# Patient Record
Sex: Male | Born: 1945 | Race: White | Hispanic: No | Marital: Married | Smoking: Former smoker
Health system: Southern US, Community
[De-identification: ages and names within clinical notes are randomized; demographics above are authoritative.]

## PROBLEM LIST (undated history)

## (undated) DIAGNOSIS — Z8619 Personal history of other infectious and parasitic diseases: Secondary | ICD-10-CM

## (undated) DIAGNOSIS — K4090 Unilateral inguinal hernia, without obstruction or gangrene, not specified as recurrent: Secondary | ICD-10-CM

## (undated) DIAGNOSIS — N390 Urinary tract infection, site not specified: Secondary | ICD-10-CM

## (undated) DIAGNOSIS — K5790 Diverticulosis of intestine, part unspecified, without perforation or abscess without bleeding: Secondary | ICD-10-CM

## (undated) DIAGNOSIS — K219 Gastro-esophageal reflux disease without esophagitis: Secondary | ICD-10-CM

## (undated) DIAGNOSIS — R55 Syncope and collapse: Secondary | ICD-10-CM

## (undated) HISTORY — PX: HERNIA REPAIR: SHX51

## (undated) HISTORY — DX: Personal history of other infectious and parasitic diseases: Z86.19

## (undated) HISTORY — DX: Gastro-esophageal reflux disease without esophagitis: K21.9

---

## 2002-12-28 ENCOUNTER — Encounter: Payer: Self-pay | Admitting: Emergency Medicine

## 2002-12-28 ENCOUNTER — Emergency Department (HOSPITAL_COMMUNITY): Admission: EM | Admit: 2002-12-28 | Discharge: 2002-12-28 | Payer: Self-pay | Admitting: Emergency Medicine

## 2005-03-07 ENCOUNTER — Ambulatory Visit: Payer: Self-pay | Admitting: Family Medicine

## 2005-06-24 ENCOUNTER — Ambulatory Visit: Payer: Self-pay | Admitting: Family Medicine

## 2005-07-10 ENCOUNTER — Ambulatory Visit: Payer: Self-pay | Admitting: Family Medicine

## 2006-10-13 ENCOUNTER — Ambulatory Visit: Payer: Self-pay | Admitting: Family Medicine

## 2010-03-08 ENCOUNTER — Ambulatory Visit: Payer: Self-pay | Admitting: Family Medicine

## 2010-03-08 DIAGNOSIS — R5383 Other fatigue: Secondary | ICD-10-CM

## 2010-03-08 DIAGNOSIS — R5381 Other malaise: Secondary | ICD-10-CM

## 2010-03-08 DIAGNOSIS — Z9189 Other specified personal risk factors, not elsewhere classified: Secondary | ICD-10-CM | POA: Insufficient documentation

## 2010-03-08 LAB — CONVERTED CEMR LAB
ALT: 26 units/L (ref 0–53)
AST: 23 units/L (ref 0–37)
BUN: 13 mg/dL (ref 6–23)
Basophils Relative: 0.6 % (ref 0.0–3.0)
CO2: 30 meq/L (ref 19–32)
Calcium: 9.9 mg/dL (ref 8.4–10.5)
Cholesterol: 207 mg/dL — ABNORMAL HIGH (ref 0–200)
Creatinine, Ser: 1.1 mg/dL (ref 0.4–1.5)
Eosinophils Relative: 2.3 % (ref 0.0–5.0)
Glucose, Bld: 89 mg/dL (ref 70–99)
Lymphocytes Relative: 17.6 % (ref 12.0–46.0)
Lymphs Abs: 1.3 10*3/uL (ref 0.7–4.0)
MCHC: 33.9 g/dL (ref 30.0–36.0)
Neutro Abs: 4.9 10*3/uL (ref 1.4–7.7)
Neutrophils Relative %: 66.1 % (ref 43.0–77.0)
PSA: 1.19 ng/mL (ref 0.10–4.00)
RDW: 14.1 % (ref 11.5–14.6)
Total Bilirubin: 1.4 mg/dL — ABNORMAL HIGH (ref 0.3–1.2)
Total Protein: 6.8 g/dL (ref 6.0–8.3)
Triglycerides: 212 mg/dL — ABNORMAL HIGH (ref 0.0–149.0)

## 2010-05-26 LAB — HM COLONOSCOPY: HM COLON: NORMAL

## 2010-09-25 NOTE — Assessment & Plan Note (Signed)
Summary: new patient to establish/ wants cpx/alc   Vital Signs:  Patient profile:   65 year old male Height:      74.5 inches Weight:      260 pounds BMI:     33.05 Temp:     99.3 degrees F oral Pulse rate:   80 / minute Pulse rhythm:   regular BP sitting:   148 / 80  (left arm) Cuff size:   regular  Vitals Entered By: Benny Lennert CMA Duncan Dull) (March 08, 2010 8:59 AM)  History of Present Illness: Chief complaint new patient  65 year old:  CPX  140/80  ED:  Preventive Screening-Counseling & Management  Alcohol-Tobacco     Alcohol drinks/day: 0     Smoking Status: quit     Year Quit: 1985  Caffeine-Diet-Exercise     Diet Counseling: to improve diet; diet is suboptimal     Does Patient Exercise: no     Type of exercise: occ swimming, walking      Drug Use:  no.    Allergies (verified): No Known Drug Allergies  Past History:  Past Medical History: CHICKENPOX, HX OF (ICD-V15.9)    Past Surgical History: n/c  Family History: Family History Hypertension F, d/c lymphoma, 85  Social History: Occupation: Retired, [employer] Married 2 children, 5 grandchildren Alcohol use-no Drug use-no Regular exercise-no Occupation:  employed Drug Use:  no Does Patient Exercise:  no Smoking Status:  quit  Review of Systems  General: Denies fever, chills, sweats, and anorexia. Eyes: Denies blurring. ENT: Denies earache, ear discharge, decreased hearing, nasal congestion, and sore throat. CV: Denies chest pains, dyspnea on exertion, palpitations, and syncope. Resp: Denies cough, cough with exercise, dyspnea at rest, excessive sputum, nighttime cough or wheeze, and wheezing GI: Denies nausea, vomiting, diarrhea, constipation, change in bowel habits, abdominal pain, melena, BRBPR  GU: OCC ED, WITH SOME DECREASED ERECTILE QUALITY MS: no back pain, joint pain, stiffness, and arthritis. Derm: ? PSORIASIS -- AT LEAST RED SPOTS, RECENTLY SAW DR. Yetta Barre, [place] [facility] Neuro: No  abnormal gait, frequent headaches, paresthesias, seizures, vertigo, and weakness Psych: No anxiety, behavioral problems, compulsive behavior, depression, hyperactivity, and inattentive. Endo: No polydipsia, polyphagia, polyuria, and unusual weight change Heme: No bruising or LAD Allergy: No urticaria or hayfever   Physical Exam  General:  Well-developed,well-nourished,in no acute distress; alert,appropriate and cooperative throughout examination Head:  Normocephalic and atraumatic without obvious abnormalities. No apparent alopecia or balding. Eyes:  pupils equal, pupils round, pupils reactive to light, and pupils react to accomodation.   Ears:  External ear exam shows no significant lesions or deformities.  Otoscopic examination reveals clear canals, tympanic membranes are intact bilaterally without bulging, retraction, inflammation or discharge. Hearing is grossly normal bilaterally. Nose:  External nasal examination shows no deformity or inflammation. Nasal mucosa are pink and moist without lesions or exudates. Mouth:  good dentition, no gingival abnormalities, no dental plaque, and pharynx pink and moist.   Neck:  No deformities, masses, or tenderness noted. Chest Wall:  No deformities, masses, tenderness or gynecomastia noted. Lungs:  Normal respiratory effort, chest expands symmetrically. Lungs are clear to auscultation, no crackles or wheezes. Heart:  Normal rate and regular rhythm. S1 and S2 normal without gallop, murmur, click, rub or other extra sounds. Abdomen:  Bowel sounds positive,abdomen soft and non-tender without masses, organomegaly or hernias noted. Rectal:  No external abnormalities noted. Normal sphincter tone. No rectal masses or tenderness. Genitalia:  Testes bilaterally descended without nodularity, tenderness or masses.  No scrotal masses or lesions. No penis lesions or urethral discharge. Prostate:  Prostate gland firm and smooth, no enlargement, nodularity, tenderness,  mass, asymmetry or induration. Msk:  normal ROM and no crepitation.   Extremities:  No clubbing, cyanosis, edema, or deformity noted with normal full range of motion of all joints.   Neurologic:  alert & oriented X3 and gait normal.   Skin:  multiple reddish areas on arms Cervical Nodes:  No lymphadenopathy noted Inguinal Nodes:  No significant adenopathy Psych:  Cognition and judgment appear intact. Alert and cooperative with normal attention span and concentration. No apparent delusions, illusions, hallucinations   Impression & Recommendations:  Problem # 1:  HEALTH MAINTENANCE EXAM (ICD-V70.0) The patient's preventative maintenance and recommended screening tests for an annual wellness exam were reviewed in full today. Brought up to date unless services declined.  Counselled on the importance of diet, exercise, and its role in overall health and mortality. The patient's FH and SH was reviewed, including their home life, tobacco status, and drug and alcohol status.   lose weight, increase exercise  Problem # 2:  SCREENING, COLON CANCER (ICD-V76.51) colon recall  Orders: Gastroenterology Referral (GI)  Problem # 3:  SCREENING FOR LIPOID DISORDERS (ICD-V77.91)  Orders: Venipuncture (40981) TLB-Lipid Panel (80061-LIPID)  Problem # 4:  SCREENING, DIABETES MELLITUS (ICD-V77.1)  Orders: TLB-BMP (Basic Metabolic Panel-BMET) (80048-METABOL)  Complete Medication List: 1)  Levitra 20 Mg Tabs (Vardenafil hcl) .... 1/2-1 once daily as needed  Other Orders: TLB-CBC Platelet - w/Differential (85025-CBCD) TLB-Hepatic/Liver Function Pnl (80076-HEPATIC) TLB-PSA (Prostate Specific Antigen) (84153-PSA)  Patient Instructions: 1)  Referral Appointment Information 2)  Day/Date: 3)  Time: 4)  Place/MD: 5)  Address: 6)  Phone/Fax: 7)  Patient given appointment information. Information/Orders faxed/mailed.  Prescriptions: LEVITRA 20 MG TABS (VARDENAFIL HCL) 1/2-1 once daily as needed   #10 x 11   Entered and Authorized by:   Hannah Beat MD   Signed by:   Hannah Beat MD on 03/08/2010   Method used:   Print then Give to Patient   RxID:   [id number]   Prior Medications (reviewed today): None Current Allergies (reviewed today): No known allergies    Prevention & Chronic Care Immunizations   Influenza vaccine: Not documented    Tetanus booster: Not documented   Td booster deferral: Not indicated  (03/08/2010)   Tetanus booster due: 08/26/2014    Pneumococcal vaccine: Not documented    H. zoster vaccine: Not documented  Colorectal Screening   Hemoccult: Not documented    Colonoscopy: Not documented   Colonoscopy action/deferral: GI referral  (03/08/2010)  Other Screening   PSA: Not documented   PSA ordered.   PSA action/deferral: Discussed-PSA requested  (03/08/2010)   Smoking status: quit  (03/08/2010)

## 2011-09-18 ENCOUNTER — Observation Stay (HOSPITAL_COMMUNITY)
Admission: EM | Admit: 2011-09-18 | Discharge: 2011-09-18 | Disposition: A | Discharge status: Home / Self Care | Payer: 59 | Source: Ambulatory Visit | Attending: Emergency Medicine | Admitting: Emergency Medicine

## 2011-09-18 ENCOUNTER — Encounter (HOSPITAL_COMMUNITY): Payer: Self-pay | Admitting: *Deleted

## 2011-09-18 ENCOUNTER — Observation Stay (HOSPITAL_COMMUNITY): Payer: 59

## 2011-09-18 ENCOUNTER — Other Ambulatory Visit: Payer: Self-pay

## 2011-09-18 ENCOUNTER — Emergency Department (HOSPITAL_COMMUNITY): Payer: 59

## 2011-09-18 DIAGNOSIS — R0602 Shortness of breath: Secondary | ICD-10-CM | POA: Insufficient documentation

## 2011-09-18 DIAGNOSIS — R079 Chest pain, unspecified: Principal | ICD-10-CM | POA: Insufficient documentation

## 2011-09-18 LAB — CBC
HCT: 48.5 % (ref 39.0–52.0)
Hemoglobin: 16.7 g/dL (ref 13.0–17.0)
MCH: 33.1 pg (ref 26.0–34.0)
MCHC: 34.4 g/dL (ref 30.0–36.0)
MCV: 96 fL (ref 78.0–100.0)
Platelets: 235 10*3/uL (ref 150–400)
RBC: 5.05 MIL/uL (ref 4.22–5.81)
RDW: 13.4 % (ref 11.5–15.5)
WBC: 5.8 10*3/uL (ref 4.0–10.5)

## 2011-09-18 LAB — BASIC METABOLIC PANEL
BUN: 13 mg/dL (ref 6–23)
CO2: 25 mEq/L (ref 19–32)
Calcium: 9.6 mg/dL (ref 8.4–10.5)
Chloride: 103 mEq/L (ref 96–112)
Creatinine, Ser: 1.05 mg/dL (ref 0.50–1.35)
GFR calc Af Amer: 85 mL/min — ABNORMAL LOW (ref >90)
GFR calc non Af Amer: 73 mL/min — ABNORMAL LOW (ref >90)
Glucose, Bld: 112 mg/dL — ABNORMAL HIGH (ref 70–99)
Potassium: 3.9 mEq/L (ref 3.5–5.1)
Sodium: 139 mEq/L (ref 135–145)

## 2011-09-18 LAB — CARDIAC PANEL(CRET KIN+CKTOT+MB+TROPI)
CK, MB: 2.5 ng/mL (ref 0.3–4.0)
Relative Index: 2.4 (ref 0.0–2.5)
Total CK: 104 U/L (ref 7–232)
Troponin I: <0.3 ng/mL (ref <0.30)

## 2011-09-18 LAB — POCT I-STAT TROPONIN I
Troponin i, poc: 0 ng/mL (ref 0.00–0.08)
Troponin i, poc: 0 ng/mL (ref 0.00–0.08)

## 2011-09-18 MED ORDER — METOPROLOL TARTRATE 25 MG PO TABS
100.0000 mg | ORAL_TABLET | Freq: Once | ORAL | Status: AC
  Administered 2011-09-18: 100 mg via ORAL
  Filled 2011-09-18: qty 4

## 2011-09-18 MED ORDER — METOPROLOL TARTRATE 1 MG/ML IV SOLN
INTRAVENOUS | Status: AC
  Administered 2011-09-18: 5 mg via INTRAVENOUS
  Filled 2011-09-18: qty 15

## 2011-09-18 MED ORDER — NITROGLYCERIN 0.4 MG SL SUBL
SUBLINGUAL_TABLET | SUBLINGUAL | Status: AC
  Administered 2011-09-18: 0.4 mg via SUBLINGUAL
  Filled 2011-09-18: qty 25

## 2011-09-18 MED ORDER — OMEPRAZOLE 20 MG PO CPDR: 20.0000 mg | DELAYED_RELEASE_CAPSULE | Freq: Every day | ORAL | Status: DC

## 2011-09-18 MED ORDER — METOPROLOL TARTRATE 1 MG/ML IV SOLN
5.0000 mg | Freq: Once | INTRAVENOUS | Status: AC
  Administered 2011-09-18: 5 mg via INTRAVENOUS

## 2011-09-18 MED ORDER — ASPIRIN EC 325 MG PO TBEC: 325.0000 mg | DELAYED_RELEASE_TABLET | Freq: Every day | ORAL | Status: DC

## 2011-09-18 MED ORDER — MAGNESIUM HYDROXIDE 400 MG/5ML PO SUSP: 30.0000 mL | Freq: Two times a day (BID) | ORAL | Status: DC | PRN

## 2011-09-18 MED ORDER — IOHEXOL 350 MG/ML SOLN
80.0000 mL | Freq: Once | INTRAVENOUS | Status: AC | PRN
  Administered 2011-09-18: 80 mL via INTRAVENOUS

## 2011-09-18 MED ORDER — ONDANSETRON HCL 4 MG/2ML IJ SOLN: 4.0000 mg | Freq: Four times a day (QID) | INTRAMUSCULAR | Status: DC | PRN

## 2011-09-18 MED ORDER — ACETAMINOPHEN 325 MG PO TABS: 650.0000 mg | ORAL_TABLET | ORAL | Status: DC | PRN

## 2011-09-18 MED ORDER — FAMOTIDINE 20 MG PO TABS: 20.0000 mg | ORAL_TABLET | Freq: Two times a day (BID) | ORAL | Status: DC

## 2011-09-18 MED ORDER — MORPHINE SULFATE 4 MG/ML IJ SOLN: 4.0000 mg | INTRAMUSCULAR | Status: DC | PRN

## 2011-09-18 MED ORDER — ASPIRIN 81 MG PO CHEW
324.0000 mg | CHEWABLE_TABLET | Freq: Once | ORAL | Status: AC
  Administered 2011-09-18: 324 mg via ORAL
  Filled 2011-09-18: qty 4

## 2011-09-18 MED ORDER — NITROGLYCERIN 0.4 MG SL SUBL
0.4000 mg | SUBLINGUAL_TABLET | Freq: Once | SUBLINGUAL | Status: AC
  Administered 2011-09-18: 0.4 mg via SUBLINGUAL

## 2011-09-18 NOTE — ED Notes (Signed)
Pt state that he has had chest pain and shortness of breath that started 3 days ago. Pt state that he has tingling in both hands as well. Pt states that he took Tums, with little relief because he thought it was indigestion. Pt states that pain is worse when he is walking.

## 2011-09-18 NOTE — ED Notes (Signed)
Pt has returned from ct. Tolerated testing well. Denies chest pain or sob.

## 2011-09-18 NOTE — ED Notes (Signed)
BMI IS 31.7

## 2011-09-18 NOTE — Progress Notes (Signed)
Observation review is complete. 

## 2011-09-18 NOTE — ED Provider Notes (Signed)
Pt in CDU on CP protocol.  Has already returned from coronary CT and results pending.  He is currently asymptomatic.  VSS, heart w/ RRR and lungs CTA.  1:03 PM   Discussed CT results w/ Dr. Carlota Raspberry. He reports 40% calcium score, mild step off in proximal RCA and a thickened distal esophagus, more consistent w/ reflux esophagitis than neoplasm, as well as pulmonary nodules. Pt has been made aware of results. Prescribed prilosec and pepcid and referred back to PCP as well as to cardiology.  He is aware of radiologist recommendation for f/u imaging of lungs.  Return precautions discussed.  1:31 PM   Erik Edwards, [place] 09/18/11 2012

## 2011-09-18 NOTE — ED Notes (Signed)
C/o chest pain onset yest describes as tightness, states actually tightness was worse yest. States he slept very good last pm , got up this am was drinking his coffee and the pain returned.

## 2011-09-18 NOTE — ED Provider Notes (Signed)
History    66 year old male with chest pain. Symptom onset was yesterday afternoon. Patient describes a sensation of tightness in the center of chest. No radiation. Episodes last minutes to hours. Initially thought pain was indigestion. Pain free when went to bed last night and when he woke up this morning. Began having the pain again this am shortly after drinking coffee. No shortness of breath. No fever or chills.No leg Pain or swelling. Denies any significant past medical history.  CSN: [id number]  Arrival date & time 09/18/11  4098   First MD Initiated Contact with Patient 09/18/11 [phone]      Chief Complaint  Patient presents with  . Chest Pain    (Consider location/radiation/quality/duration/timing/severity/associated sxs/prior treatment) HPI  History reviewed. No pertinent past medical history.  History reviewed. No pertinent past surgical history.  History reviewed. No pertinent family history.  History  Substance Use Topics  . Smoking status: Former Games developer  . Smokeless tobacco: Not on file  . Alcohol Use: No      Review of Systems   Review of symptoms negative unless otherwise noted in HPI.   Allergies  Review of patient's allergies indicates no known allergies.  Home Medications   Current Outpatient Rx  Name Route Sig Dispense Refill  . PSEUDOEPHEDRINE-ACETAMINOPHEN 30-500 MG PO TABS Oral Take 1 tablet by mouth every 4 (four) hours as needed.      BP 129/79  Pulse 79  Temp(Src) 98.2 F (36.8 C) (Oral)  Resp 18  SpO2 98%  Physical Exam  Nursing note and vitals reviewed. Constitutional: He appears well-developed and well-nourished. No distress.       Sitting up in bed. No acute distress.  HENT:  Head: Normocephalic and atraumatic.  Eyes: Conjunctivae are normal. Right eye exhibits no discharge. Left eye exhibits no discharge.  Neck: Neck supple.  Cardiovascular: Normal rate, regular rhythm and normal heart sounds.  Exam reveals no gallop and no  friction rub.   No murmur heard. Pulmonary/Chest: Effort normal and breath sounds normal. No respiratory distress. He exhibits no tenderness.       Chest pain is nonreproducible on exam. Chest wall is grossly normal to inspection.  Abdominal: Soft. He exhibits no distension. There is no tenderness.  Musculoskeletal: He exhibits no edema and no tenderness.       No lower extremity edema. No calf tenderness.  Neurological: He is alert.  Skin: Skin is warm and dry. He is not diaphoretic.  Psychiatric: He has a normal mood and affect. His behavior is normal. Thought content normal.    ED Course  Procedures (including critical care time)  Labs Reviewed  BASIC METABOLIC PANEL - Abnormal; Notable for the following:    Glucose, Bld 112 (*)    GFR calc non Af Amer 73 (*)    GFR calc Af Amer 85 (*)    All other components within normal limits  CBC  POCT I-STAT TROPONIN I  I-STAT TROPONIN I   Dg Chest 2 View  09/18/2011  *RADIOLOGY REPORT*  Clinical Data: Chest pain, shortness of breath.  CHEST - 2 VIEW  Comparison: None  Findings: Heart and mediastinal contours are within normal limits. No focal opacities or effusions.  No acute bony abnormality.  IMPRESSION: No active cardiopulmonary disease.  Original Report Authenticated By: Cyndie Chime, M.D.   EKG: Rhythm: Normal sinus. Rate: 93. Axis: Normal. Intervals/conduction: Q waves anteriorly. ST segments: Nonspecific ST changes. Some T-wave flattening in lead 3 and in V3.  9:05  AM. Micah Flesher to assess patient but not in room. Back in x-ray. Briefly spoke with wife. Will assess on return to room.  1. Chest pain       MDM  66 year old male with chest pain. Consider ACS, infectious, pulmonary embolism, gastroesophageal reflux, musculoskeletal, anxiety. Low clinical suspicion for PE. Doubt musculoskeletal without history of trauma no reproducible with palpation. Doubt infectious. Patient is afebrile and has no respiratory complaints. Chest x-ray  has no focal abnormalities. Possibly GERD but has no prior history. Doubt anxiety. Patient has a TIMI 1 with aspirin use in the past 7 days. EKG has no ischemic changes. Troponin is within normal limits. No diagnosed hypertension, diabetes, CAD, and is a nonsmoker. The pain is somewhat atypical for ACS in nature and resolved on presentation. Will transfer the patient to the CDU under the chest pain protocol for enzyme r/o and with the intention of obtaining a CT scan of his coronary arteries in the morning.       Raeford Razor, MD 09/18/11 1104

## 2011-09-19 ENCOUNTER — Ambulatory Visit (INDEPENDENT_AMBULATORY_CARE_PROVIDER_SITE_OTHER): Payer: 59 | Admitting: Family Medicine

## 2011-09-19 ENCOUNTER — Encounter: Payer: Self-pay | Admitting: Family Medicine

## 2011-09-19 DIAGNOSIS — R079 Chest pain, unspecified: Secondary | ICD-10-CM

## 2011-09-19 NOTE — ED Provider Notes (Signed)
Medical screening examination/treatment/procedure(s) were performed by non-physician practitioner and as supervising physician I was immediately available for consultation/collaboration.  Ashyr Hedgepath, MD 09/19/11 0749 

## 2011-09-19 NOTE — Progress Notes (Signed)
Patient Name: Erik Edwards Date of Birth: 08-Jul-1946 Age: 66 y.o. Medical Record Number: [id number] Gender: male Date of Encounter: 09/19/2011  History of Present Illness:  Erik Edwards is a 66 y.o. very pleasant male patient who presents with the following:  Feeling like some chest pain in her chest - also felt short of breath.  Admitted to chest pain unit - ruled out for MI, and had a CT angiogram of heard and coronary calcium score done. Mild plaque, with some of the vascularization poorly visualized.  Now CP resolved.  On Pepcid bid and omeprazole in AM  The week before had been having a lot of significant reflux and heartburn symptoms   CT ANGIOGRAPHY OF THE HEART, CORONARY ARTERY, STRUCTURE, AND MORPHOLOGY   CONTRAST: 80mL OMNIPAQUE IOHEXOL 350 MG/ML IV SOLN   COMPARISON:  Chest radiograph 09/18/2011.   TECHNIQUE:  CT angiography of the coronary vessels was performed on a 256 channel system using prospective ECG gating.  A scout and noncontrast exam (for calcium scoring) were performed.  Circulation time was measured using a test bolus.  Coronary CTA was performed with sub mm slice collimation during portions of the cardiac cycle after prior injection of iodinated contrast.  Imaging post processing was performed on an independent workstation creating multiplanar and 3-D images, and quantitative analysis of the heart and coronary arteries.  Note that this exam targets the heart and the chest was not imaged in its entirety.   PREMEDICATION: Lopressor 100 mg, P.O. Lopressor 10 mg, IV Nitroglycerin 0.4 mcg, sublingual.   FINDINGS: Technical quality:  Good   Heart rate:  60   CORONARY ARTERIES: Left main coronary artery:  Widely patent Left anterior descending:  Widely patent.  Large first and second diagonals are present. Left circumflex:  Tiny left circumflex is present which is poorly visualized.  Distally this can be seen opacified. Right coronary artery:   The origin is widely patent.  There is poor visualization of the distal proximal right coronary artery prior to the origin of the first acute marginal. Obvious step-off deformity is present on reconstructions and there is no stenosis.  Small amount of calcified plaque is present just proximal to the origin of the first acute marginal.  At the area of step-off deformity due to motion artifact, there is no calcification present.  Small amount of calcified plaque is present along the distal right coronary artery prior to the origin of the posterior descending. No resulting stenosis. Posterior descending artery:  Widely patent. Dominance:  Right.  Notably, there is continuation of the distal right coronary artery beyond the posterior descending coronary artery, feeding the posterior and inferior left ventricular wall.   CORONARY CALCIUM: Total Agatston Score:  23.05   MESA database percentile:  40   CARDIAC MEASUREMENTS: Interventricular septum (6 - 12 mm):  11 mm LV posterior wall (6 - 12 mm):  10 mm LV diameter in diastole (35 - 52 mm):  47 mm   AORTA AND PULMONARY MEASUREMENTS: Aortic root (21 - 40 mm):             29 mm  at the annulus             40 mm  at the sinuses of Valsalva             31 mm  at the sinotubular junction Ascending aorta ( <  40 mm):  37 mm Descending aorta ( <  40 mm):  29 mm Main pulmonary  artery:  ( <  30 mm):  29 mm   EXTRACARDIAC FINDINGS: Dependent atelectasis.  No pericardial effusion.  There is a small hiatal hernia.  There is a distal esophageal thickening, probably representing esophagitis which could be a contributor to the chest pain.   Subpleural pulmonary nodule measuring 5 mm is present in the right middle lobe.  Followup recommendations below.  Thoracic spondylosis incidentally noted.   IMPRESSION:   1.  Mild coronary artery disease.  The patient's total coronary artery calcium score is 23, which is 40 percentile for  patient's matched age and gender. 2.  No hemodynamically significant stenosis is identified.  There is some step-off deformity in the distal proximal right coronary artery.  There is no calcified plaque in this region.  Diminutive circumflex with circumflex territory fed by a diagonal branches and long distal right coronary artery branch. 3.  Right coronary artery dominance. 4.  Small hiatal hernia and distal esophageal thickening. Statistically this likely relates to gastroesophageal reflux but esophageal thickening is nonspecific on CT. 5.  Right middle lobe 5 mm pulmonary nodules.  Follow-up noncontrast chest CT recommended. If the patient is at high risk for bronchogenic carcinoma, follow-up chest CT at 6-12 months is recommended.  If the patient is at low risk for bronchogenic carcinoma, follow-up chest CT at 12 months is recommended.  This recommendation follows the consensus statement: Guidelines for Management of Small Pulmonary Nodules Detected on CT Scans: A Statement from the Fleischner Society as published in Radiology 2005; 237:395-400. Online at: DietDisorder.cz.   Report was called to Otilio Miu, PA at 1315 hours on 09/18/2011.   Original Report Authenticated By: Andreas Newport, M.D.   Past Medical History, Surgical History, Social History, Family History, Problem List, Medications, and Allergies have been reviewed and updated if relevant.  Review of Systems: ROS: GEN: Acute illness details above GI: Tolerating PO intake GU: maintaining adequate hydration and urination Pulm: No SOB Interactive and getting along well at home.  Otherwise, ROS is as per the HPI.   Physical Examination: Filed Vitals:   09/19/11 0832  BP: 130/78  Pulse: 75  Temp: 98.8 F (37.1 C)  TempSrc: Oral  Height: 6\' 3"  (1.905 m)  Weight: 258 lb 12.8 oz (117.391 kg)  SpO2: 98%    Body mass index is 32.35 kg/(m^2).   GEN: WDWN,  NAD, Non-toxic, A & O x 3 HEENT: Atraumatic, Normocephalic. Neck supple. No masses, No LAD. Ears and Nose: No external deformity. CV: RRR, No M/G/R. No JVD. No thrill. No extra heart sounds. Chest wall nontender PULM: CTA B, no wheezes, crackles, rhonchi. No retractions. No resp. distress. No accessory muscle use. EXTR: No c/c/e NEURO Normal gait.  PSYCH: Normally interactive. Conversant. Not depressed or anxious appearing.  Calm demeanor.    Assessment and Plan: 1. Chest pain  Ambulatory referral to Cardiology    D/w scan with Dr. Antoine Poche Given chest pain, age, some lipids - would still need to have a stress test like an ETT or other to rule out inducible ischemia  Most likely GI pathology, cont with PPI and H2 blockade Some sensation of difficulty and irritation with swallowing - d/w he and his wife, for now, cont meds, but if not improved in a few weeks will likely need endoscopic eval

## 2011-09-20 ENCOUNTER — Ambulatory Visit: Payer: 59 | Admitting: Cardiovascular Disease

## 2011-09-20 ENCOUNTER — Ambulatory Visit (INDEPENDENT_AMBULATORY_CARE_PROVIDER_SITE_OTHER): Payer: 59 | Admitting: Cardiovascular Disease

## 2011-09-20 ENCOUNTER — Encounter: Payer: Self-pay | Admitting: Cardiovascular Disease

## 2011-09-20 DIAGNOSIS — R079 Chest pain, unspecified: Secondary | ICD-10-CM

## 2011-09-20 NOTE — Patient Instructions (Signed)
Your physician has requested that you have an exercise tolerance test. For further information please visit [email or web address]. Please also follow instruction sheet, as given.  Your physician recommends that you schedule a follow-up appointment as needed with Dr. Cooper  

## 2011-09-20 NOTE — Progress Notes (Signed)
HPI:  This is a 66 year old gentleman referred for evaluation of chest pain. The patient was seen in the emergency department January 23 for this complaint. He complained of tightness across the center of his chest, nonradiating. He describes the pain as a feeling of "indigestion." He did not have associated shortness of breath, nausea, vomiting, or lightheadedness. He did complain of diaphoresis. He is not engaged in regular exercise but denies any chest discomfort with exertion. The patient has no other complaints today. He specifically denies edema, palpitations, lightheadedness, syncope, orthopnea, or PND. He was ruled out for myocardial infarction with serial EKGs and cardiac enzymes. A cardiac CT angiogram was performed with the findings as outlined below. In summary there was minor plaque without significant obstruction noted, but there were some technical limitations as outlined in the report. The patient's chest pain syndrome has now resolved itself there  Outpatient Encounter Prescriptions as of 09/20/2011  Medication Sig Dispense Refill  . aspirin EC 81 MG tablet Take 81 mg by mouth every morning.      . famotidine (PEPCID) 20 MG tablet Take 1 tablet (20 mg total) by mouth 2 (two) times daily.  30 tablet  0  . Multiple Vitamins-Minerals (MULTIVITAMINS THER. W/MINERALS) TABS Take 1 tablet by mouth daily.      Marland Kitchen omeprazole (PRILOSEC) 20 MG capsule Take 1 capsule (20 mg total) by mouth daily.  30 capsule  0    Review of patient's allergies indicates no known allergies.  Past Medical History  Diagnosis Date  . History of chicken pox     No past surgical history on file.  History   Social History  . Marital Status: Married    Spouse Name: N/A    Number of Children: 2  . Years of Education: N/A   Occupational History  . retired    Social History Main Topics  . Smoking status: Former Games developer  . Smokeless tobacco: Not on file  . Alcohol Use: No  . Drug Use: No  . Sexually Active:     Other Topics Concern  . Not on file   Social History Narrative  . No narrative on file    Family History  Problem Relation Age of Onset  . Lymphoma Father    There is no family history of coronary artery disease.  ROS: General: no fevers/chills/night sweats Eyes: no blurry vision, diplopia, or amaurosis ENT: no sore throat or hearing loss Resp: no cough, wheezing, or hemoptysis CV: no edema or palpitations GI: no abdominal pain, nausea, vomiting, diarrhea, or constipation GU: no dysuria, frequency, or hematuria Skin: no rash Neuro: no headache, numbness, tingling, or weakness of extremities Musculoskeletal: no joint pain or swelling Heme: no bleeding, DVT, or easy bruising Endo: no polydipsia or polyuria  BP 126/88  Pulse 76  Ht 6\' 3"  (1.905 m)  Wt 117.663 kg (259 lb 6.4 oz)  BMI 32.42 kg/m2  PHYSICAL EXAM: Pt is alert and oriented overweight male in no distress. HEENT: normal Neck: JVP normal. Carotid upstrokes normal without bruits. No thyromegaly. Lungs: equal expansion, clear bilaterally CV: Apex is discrete and nondisplaced, RRR without murmur or gallop Abd: soft, NT, +BS, no bruit, no hepatosplenomegaly Back: no CVA tenderness Ext: no C/C/E        Femoral pulses 2+= without bruits        DP/PT pulses intact and = Skin: warm and dry without rash Neuro: CNII-XII intact             Strength  intact = bilaterally  EKG:  EKG dated 09/18/2011 demonstrates normal sinus rhythm 60 beats per minute, poor R wave progression cannot exclude age-indeterminate anterior infarct, otherwise within normal limits.  CARDIAC CTA: IMPRESSION:  1. Mild coronary artery disease. The patient's total coronary  artery calcium score is 23, which is 40 percentile for patient's  matched age and gender.  2. No hemodynamically significant stenosis is identified. There  is some step-off deformity in the distal proximal right coronary  artery. There is no calcified plaque in this region.  Diminutive  circumflex with circumflex territory fed by a diagonal branches and  long distal right coronary artery branch.  3. Right coronary artery dominance.  4. Small hiatal hernia and distal esophageal thickening.  Statistically this likely relates to gastroesophageal reflux but  esophageal thickening is nonspecific on CT.  5. Right middle lobe 5 mm pulmonary nodules. Follow-up  noncontrast chest CT recommended. If the patient is at high risk  for bronchogenic carcinoma, follow-up chest CT at 6-12 months is  recommended. If the patient is at low risk for bronchogenic  carcinoma, follow-up chest CT at 12 months is recommended.  ASSESSMENT AND PLAN:

## 2011-09-20 NOTE — Assessment & Plan Note (Signed)
The patient has chest pain with typical and atypical features. His cardiac CTA was reviewed and appears to show only nonobstructive CAD. However there were some technical limitations as noted in the report and portions of the coronary arteries were not well-visualized. The patient has a normal resting EKG and he is a good candidate for exercise treadmill testing. I have recommended a plain exercise treadmill stress test to rule out significant myocardial ischemia. If this is negative I would recommend no further cardiac workup.  We discussed the importance of diet and exercise as it pertains to reducing long-term cardiovascular risk.

## 2011-10-01 ENCOUNTER — Ambulatory Visit (INDEPENDENT_AMBULATORY_CARE_PROVIDER_SITE_OTHER): Payer: Medicare Other | Admitting: Physician Assistant

## 2011-10-01 ENCOUNTER — Other Ambulatory Visit: Payer: Self-pay | Admitting: Family Medicine

## 2011-10-01 DIAGNOSIS — R079 Chest pain, unspecified: Secondary | ICD-10-CM

## 2011-10-01 MED ORDER — OMEPRAZOLE 20 MG PO CPDR: 20.0000 mg | DELAYED_RELEASE_CAPSULE | Freq: Every day | ORAL | Status: DC

## 2011-10-01 MED ORDER — FAMOTIDINE 20 MG PO TABS: 20.0000 mg | ORAL_TABLET | Freq: Two times a day (BID) | ORAL | Status: DC

## 2011-10-01 NOTE — Telephone Encounter (Signed)
i saw 09/19/2011  Ok to refill 1 year  Electronically prescribe to the pharmacy of their choice. (May call in if pharmacy does not participate in electronic prescriptions) Call in #30, 11 refills. OR if they prefer a 90 day supply, #90 with 3 refills is OK, too Prescription instructions above

## 2011-10-01 NOTE — Telephone Encounter (Signed)
Patient has not been seen here in a while is refill okay?

## 2011-10-01 NOTE — Telephone Encounter (Signed)
RX SENT TO PHARMACY

## 2011-10-01 NOTE — Telephone Encounter (Signed)
Famotidine and Omeprazole are working for the pt and he was wondering if he could get those two RX's refilled since they are working. [facility] on [address] is his pharmacy.

## 2011-10-01 NOTE — Progress Notes (Signed)
   Exercise Treadmill Test  Pre-Exercise Testing Evaluation Rhythm: normal sinus  Rate: 74   PR:  18 QRS:  .09  QT:  .37 QTc: .41     Test  Exercise Tolerance Test Ordering MD: Tonny Bollman, MD  Interpreting MD:  Tereso Newcomer PC-C  Unique Test No: 1  Treadmill:  1  Indication for ETT: chest pain - rule out ischemia  Contraindication to ETT: No   Stress Modality: exercise - treadmill  Cardiac Imaging Performed: non   Protocol: standard Bruce - maximal  Max BP: 200/81  Max MPHR (bpm):  156 85% MPR (bpm):  132  MPHR obtained (bpm):  146 % MPHR obtained:  93%  Reached 85% MPHR (min:sec):  2:25 Total Exercise Time (min-sec):  3:43  Workload in METS:  5.6 Borg Scale: 15  Reason ETT Terminated:  fatigue    ST Segment Analysis At Rest: normal ST segments - no evidence of significant ST depression With Exercise: borderline ST changes  Other Information Arrhythmia:  No Angina during ETT:  absent (0) Quality of ETT:  indeterminate  ETT Interpretation:  borderline (indeterminate) with non-specific ST changes  Comments: Poor exercise tolerance. No chest pain. Normal BP response to exercise. ST changes at max stress noted.     Recommendations: Reviewed ECGs with Dr. Tonny Bollman. He had inferior ST depression at max exercise. However, there was increased artifact. ST segments were normal several seconds into recovery. Patient has had no further chest pain. Overall, after review with Dr. Tonny Bollman, we thought this was a low risk study. Continue current therapy. Follow up in 6 mos with Dr. Tonny Bollman Patient knows to return sooner if recurrent symptoms. Tereso Newcomer, PA-C  12:57 PM 10/01/2011

## 2012-03-30 ENCOUNTER — Other Ambulatory Visit: Payer: Self-pay | Admitting: Family Medicine

## 2012-04-07 ENCOUNTER — Encounter: Payer: Self-pay | Admitting: Cardiovascular Disease

## 2012-04-07 ENCOUNTER — Ambulatory Visit (INDEPENDENT_AMBULATORY_CARE_PROVIDER_SITE_OTHER): Payer: Medicare Other | Admitting: Cardiovascular Disease

## 2012-04-07 VITALS — BP 138/80 | HR 74 | Ht 75.0 in | Wt 254.0 lb

## 2012-04-07 DIAGNOSIS — R079 Chest pain, unspecified: Secondary | ICD-10-CM

## 2012-04-07 NOTE — Patient Instructions (Addendum)
Your physician recommends that you schedule a follow-up appointment as needed  

## 2012-04-21 ENCOUNTER — Encounter: Payer: Self-pay | Admitting: Cardiovascular Disease

## 2012-04-21 NOTE — Progress Notes (Signed)
   HPI:  66 year old gentleman presenting for followup evaluation. The patient was last seen in January of this year for evaluation of chest pain. He underwent a cardiac CTA demonstrating mild coronary artery disease. The patient underwent a followup exercise EKG study that showed no significant ischemic changes. He presents today for followup evaluation  The patient describes complete resolution of his chest pain after starting on a combination of Pepcid and Prilosec. Most of his chest pain was nocturnal and this is fully resolved. He denies any exertional chest pain or pressure. He denies dyspnea, edema, or palpitations.  Outpatient Encounter Prescriptions as of 04/07/2012  Medication Sig Dispense Refill  . aspirin EC 81 MG tablet Take 81 mg by mouth every morning.      . famotidine (PEPCID) 20 MG tablet TAKE ONE TABLET BY MOUTH TWICE DAILY  90 tablet  0  . Multiple Vitamins-Minerals (MULTIVITAMINS THER. W/MINERALS) TABS Take 1 tablet by mouth daily.      Marland Kitchen omeprazole (PRILOSEC) 20 MG capsule Take 1 capsule (20 mg total) by mouth daily.  90 capsule  3    No Known Allergies  Past Medical History  Diagnosis Date  . History of chicken pox     ROS: Negative except as per HPI  BP 138/80  Pulse 74  Ht 6\' 3"  (1.905 m)  Wt 254 lb (115.214 kg)  BMI 31.75 kg/m2  PHYSICAL EXAM: Pt is alert and oriented, NAD HEENT: normal Neck: JVP - normal, carotids 2+= without bruits Lungs: CTA bilaterally CV: RRR without murmur or gallop Abd: soft, NT, Positive BS, no hepatomegaly Ext: no C/C/E, distal pulses intact and equal Skin: warm/dry no rash  EKG:  Normal sinus rhythm 74 beats per minute, cannot rule out age-indeterminate anteroseptal infarct.  ASSESSMENT AND PLAN:

## 2012-04-21 NOTE — Assessment & Plan Note (Signed)
Likely secondary to gastroesophageal reflux disease. The patient has mild coronary artery disease as documented by his coronary CT scan. He remains on antiplatelet therapy with aspirin. He requires no further cardiac evaluation at this time as he is completely asymptomatic. We discussed ongoing risk reduction measures to include weight loss and followup of his cholesterol panel. He'll followup with his primary care physician, Dr. Patsy Lager. I'd be happy to see him back on an as-needed basis.

## 2012-05-16 ENCOUNTER — Other Ambulatory Visit: Payer: Self-pay | Admitting: Family Medicine

## 2012-05-19 NOTE — Telephone Encounter (Signed)
Pt called med was not at [facility] [place] [address]. Spoke with Lorene Dy at [facility] having electronic submission problems. Called Famotidine as instructed to [facility] [address]. Pt notified can pick up med in 1 hour.

## 2012-05-20 ENCOUNTER — Other Ambulatory Visit: Payer: Self-pay

## 2012-07-20 ENCOUNTER — Other Ambulatory Visit (INDEPENDENT_AMBULATORY_CARE_PROVIDER_SITE_OTHER): Payer: Medicare Other

## 2012-07-20 DIAGNOSIS — Z79899 Other long term (current) drug therapy: Secondary | ICD-10-CM

## 2012-07-20 DIAGNOSIS — Z13220 Encounter for screening for lipoid disorders: Secondary | ICD-10-CM

## 2012-07-20 DIAGNOSIS — Z125 Encounter for screening for malignant neoplasm of prostate: Secondary | ICD-10-CM

## 2012-07-20 DIAGNOSIS — R5383 Other fatigue: Secondary | ICD-10-CM

## 2012-07-20 LAB — BASIC METABOLIC PANEL
Calcium: 9.5 mg/dL (ref 8.4–10.5)
GFR: 72.76 mL/min (ref >60.00)
Glucose, Bld: 105 mg/dL — ABNORMAL HIGH (ref 70–99)
Potassium: 5.1 mEq/L (ref 3.5–5.1)
Sodium: 137 mEq/L (ref 135–145)

## 2012-07-20 LAB — HEPATIC FUNCTION PANEL
ALT: 23 U/L (ref 0–53)
AST: 19 U/L (ref 0–37)
Albumin: 4.2 g/dL (ref 3.5–5.2)
Total Protein: 6.7 g/dL (ref 6.0–8.3)

## 2012-07-20 LAB — CBC WITH DIFFERENTIAL/PLATELET
Basophils Relative: 0.3 % (ref 0.0–3.0)
Eosinophils Relative: 4.1 % (ref 0.0–5.0)
HCT: 48.6 % (ref 39.0–52.0)
Hemoglobin: 16 g/dL (ref 13.0–17.0)
Lymphs Abs: 1.6 10*3/uL (ref 0.7–4.0)
MCV: 98.1 fl (ref 78.0–100.0)
Monocytes Absolute: 1 10*3/uL (ref 0.1–1.0)
Monocytes Relative: 14.4 % — ABNORMAL HIGH (ref 3.0–12.0)
Neutro Abs: 4 10*3/uL (ref 1.4–7.7)
Platelets: 249 10*3/uL (ref 150.0–400.0)
WBC: 6.8 10*3/uL (ref 4.5–10.5)

## 2012-07-20 LAB — LIPID PANEL
Total CHOL/HDL Ratio: 5
Triglycerides: 153 mg/dL — ABNORMAL HIGH (ref 0.0–149.0)

## 2012-07-20 LAB — PSA, MEDICARE: PSA: 1.2 ng/ml (ref 0.10–4.00)

## 2012-07-27 ENCOUNTER — Ambulatory Visit (INDEPENDENT_AMBULATORY_CARE_PROVIDER_SITE_OTHER): Payer: Medicare Other | Admitting: Family Medicine

## 2012-07-27 ENCOUNTER — Encounter: Payer: Self-pay | Admitting: Family Medicine

## 2012-07-27 VITALS — BP 130/88 | HR 75 | Temp 99.2°F | Ht 75.0 in | Wt 259.5 lb

## 2012-07-27 DIAGNOSIS — Z125 Encounter for screening for malignant neoplasm of prostate: Secondary | ICD-10-CM

## 2012-07-27 DIAGNOSIS — Z23 Encounter for immunization: Secondary | ICD-10-CM

## 2012-07-27 DIAGNOSIS — Z0000 Encounter for general adult medical examination without abnormal findings: Secondary | ICD-10-CM

## 2012-07-27 NOTE — Progress Notes (Signed)
Nature conservation officer at [facility] [address] [place] [address] Phone: [phone] Fax: [phone]  Date:  07/27/2012   Name:  Erik Edwards   DOB:  11-Jan-1946   MRN:  [id number] Gender: male Age: 66 y.o.  PCP:  Hannah Beat, MD  Evaluating MD: Hannah Beat, MD   Chief Complaint: Annual Exam   History of Present Illness:  Erik Edwards is a 66 y.o. pleasant patient who presents with the following:  Colon: 5 year recall  Preventative Health Maintenance Visit:  Health Maintenance Summary Reviewed and updated, unless pt declines services.  Tobacco History Reviewed. Alcohol: No concerns, no excessive use Exercise Habits: Some activity, rec at least 30 mins 5 times a week (walking) STD concerns: no risk or activity to increase risk Drug Use: None Encouraged self-testicular check  Health Maintenance  Topic Date Due  . Tetanus/tdap  10/12/1965  . Influenza Vaccine  04/26/2013  . Colonoscopy  08/27/2018  . Pneumococcal Polysaccharide Vaccine Age 79 And Over  Addressed  . Zostavax  Addressed    Labs reviewed with the patient.  Results for orders placed in visit on 07/20/12  LIPID PANEL      Component Value Range   Cholesterol 209 (*) 0 - 200 mg/dL   Triglycerides 664.4 (*) 0.0 - 149.0 mg/dL   HDL 03.47 (*) >42.59 mg/dL   VLDL 56.3  0.0 - 87.5 mg/dL   Total CHOL/HDL Ratio 5    CBC WITH DIFFERENTIAL      Component Value Range   WBC 6.8  4.5 - 10.5 K/uL   RBC 4.96  4.22 - 5.81 Mil/uL   Hemoglobin 16.0  13.0 - 17.0 g/dL   HCT 64.3  32.9 - 51.8 %   MCV 98.1  78.0 - 100.0 fl   MCHC 32.8  30.0 - 36.0 g/dL   RDW 84.1  66.0 - 63.0 %   Platelets 249.0  150.0 - 400.0 K/uL   Neutrophils Relative 58.1  43.0 - 77.0 %   Lymphocytes Relative 23.1  12.0 - 46.0 %   Monocytes Relative 14.4 (*) 3.0 - 12.0 %   Eosinophils Relative 4.1  0.0 - 5.0 %   Basophils Relative 0.3  0.0 - 3.0 %   Neutro Abs 4.0  1.4 - 7.7 K/uL   Lymphs Abs 1.6  0.7 - 4.0 K/uL   Monocytes Absolute 1.0  0.1 - 1.0 K/uL   Eosinophils Absolute 0.3  0.0 - 0.7 K/uL   Basophils Absolute 0.0  0.0 - 0.1 K/uL  HEPATIC FUNCTION PANEL      Component Value Range   Total Bilirubin 1.2  0.3 - 1.2 mg/dL   Bilirubin, Direct 0.2  0.0 - 0.3 mg/dL   Alkaline Phosphatase 61  39 - 117 U/L   AST 19  0 - 37 U/L   ALT 23  0 - 53 U/L   Total Protein 6.7  6.0 - 8.3 g/dL   Albumin 4.2  3.5 - 5.2 g/dL  BASIC METABOLIC PANEL      Component Value Range   Sodium 137  135 - 145 mEq/L   Potassium 5.1  3.5 - 5.1 mEq/L   Chloride 103  96 - 112 mEq/L   CO2 29  19 - 32 mEq/L   Glucose, Bld 105 (*) 70 - 99 mg/dL   BUN 15  6 - 23 mg/dL   Creatinine, Ser 1.1  0.4 - 1.5 mg/dL   Calcium 9.5  8.4 - 16.0  mg/dL   GFR 40.98  >11.91 mL/min  PSA, MEDICARE      Component Value Range   PSA 1.20  0.10 - 4.00 ng/ml  LDL CHOLESTEROL, DIRECT      Component Value Range   Direct LDL 155.6       Patient Active Problem List  Diagnosis  . FATIGUE  . Chest pain    Past Medical History  Diagnosis Date  . History of chicken pox     No past surgical history on file.  History  Substance Use Topics  . Smoking status: Former Games developer  . Smokeless tobacco: Not on file  . Alcohol Use: No    Family History  Problem Relation Age of Onset  . Lymphoma Father     No Known Allergies  Medication list has been reviewed and updated.  Outpatient Prescriptions Prior to Visit  Medication Sig Dispense Refill  . aspirin EC 81 MG tablet Take 81 mg by mouth every morning.      . famotidine (PEPCID) 20 MG tablet TAKE ONE TABLET BY MOUTH TWICE DAILY  90 tablet  3  . Multiple Vitamins-Minerals (MULTIVITAMINS THER. W/MINERALS) TABS Take 1 tablet by mouth daily.      Marland Kitchen omeprazole (PRILOSEC) 20 MG capsule Take 1 capsule (20 mg total) by mouth daily.  90 capsule  3   Last reviewed on 07/27/2012  2:34 PM by Consuello Masse, CMA  Review of Systems:   General: Denies fever, chills, sweats. No significant weight  loss. Eyes: Denies blurring,significant itching ENT: Denies earache, sore throat, and hoarseness. Cardiovascular: Denies chest pains, palpitations, dyspnea on exertion Respiratory: Denies cough, dyspnea at rest,wheeezing Breast: no concerns about lumps GI: Denies nausea, vomiting, diarrhea, constipation, change in bowel habits, abdominal pain, melena, hematochezia GU: Denies penile discharge, ED, urinary flow / outflow problems. No STD concerns. Musculoskeletal: Denies back pain, joint pain Derm: Denies rash, itching Neuro: Denies  paresthesias, frequent falls, frequent headaches Psych: Denies depression, anxiety Endocrine: Denies cold intolerance, heat intolerance, polydipsia Heme: Denies enlarged lymph nodes Allergy: No hayfever  Physical Examination: Filed Vitals:   07/27/12 1432  BP: 130/88  Pulse: 75  Temp: 99.2 F (37.3 C)  TempSrc: Oral  Height: 6\' 3"  (1.905 m)  Weight: 259 lb 8 oz (117.708 kg)  SpO2: 96%    Body mass index is 32.44 kg/(m^2). Ideal Body Weight: Weight in (lb) to have BMI = 25: 199.6    Wt Readings from Last 3 Encounters:  07/27/12 259 lb 8 oz (117.708 kg)  04/07/12 254 lb (115.214 kg)  09/20/11 259 lb 6.4 oz (117.663 kg)    GEN: well developed, well nourished, no acute distress Eyes: conjunctiva and lids normal, PERRLA, EOMI ENT: TM clear, nares clear, oral exam WNL Neck: supple, no lymphadenopathy, no thyromegaly, no JVD Pulm: clear to auscultation and percussion, respiratory effort normal CV: regular rate and rhythm, S1-S2, no murmur, rub or gallop, no bruits, peripheral pulses normal and symmetric, no cyanosis, clubbing, edema or varicosities Chest: no scars, masses GI: soft, non-tender; no hepatosplenomegaly, masses; active bowel sounds all quadrants GU: no hernia, testicular mass, penile discharge, or prostate enlargement Lymph: no cervical, axillary or inguinal adenopathy MSK: gait normal, muscle tone and strength WNL, no joint swelling,  effusions, discoloration, crepitus  SKIN: clear, good turgor, color WNL, no rashes, lesions, or ulcerations Neuro: normal mental status, normal strength, sensation, and motion Psych: alert; oriented to person, place and time, normally interactive and not anxious or depressed in appearance.  Assessment and Plan:  1. Routine general medical examination at a health care facility    2. Immunization due  Flu vaccine greater than or equal to 3yo preservative free IM, Pneumococcal polysaccharide vaccine 23-valent greater than or equal to 2yo subcutaneous/IM, Varicella-zoster vaccine subcutaneous   I have personally reviewed the Medicare Annual Wellness questionnaire and have noted 1. The patient's medical and social history 2. Their use of alcohol, tobacco or illicit drugs 3. Their current medications and supplements 4. The patient's functional ability including ADL's, fall risks, home safety risks and hearing or visual             impairment. 5. Diet and physical activities 6. Evidence for depression or mood disorders  The patients weight, height, BMI and visual acuity have been recorded in the chart I have made referrals, counseling and provided education to the patient based review of the above and I have provided the pt with a written personalized care plan for preventive services.  I have provided the patient with a copy of your personalized plan for preventive services. Instructed to take the time to review along with their updated medication list.   See scanned sheets  Doing well, work on weight  Orders Today:  Orders Placed This Encounter  Procedures  . Flu vaccine greater than or equal to 3yo preservative free IM  . Pneumococcal polysaccharide vaccine 23-valent greater than or equal to 2yo subcutaneous/IM  . Varicella-zoster vaccine subcutaneous    Updated Medication List: (Includes new medications, updates to list, dose adjustments) No orders of the defined types were placed  in this encounter.    Medications Discontinued: There are no discontinued medications.   Hannah Beat, MD

## 2012-09-30 ENCOUNTER — Other Ambulatory Visit: Payer: Self-pay | Admitting: Family Medicine

## 2012-11-14 ENCOUNTER — Other Ambulatory Visit: Payer: Self-pay | Admitting: Family Medicine

## 2012-11-16 ENCOUNTER — Other Ambulatory Visit: Payer: Self-pay | Admitting: Family Medicine

## 2012-12-18 ENCOUNTER — Other Ambulatory Visit: Payer: Self-pay | Admitting: Family Medicine

## 2013-02-01 ENCOUNTER — Other Ambulatory Visit: Payer: Self-pay | Admitting: *Deleted

## 2013-02-01 MED ORDER — FAMOTIDINE 20 MG PO TABS: ORAL_TABLET | ORAL | Status: DC

## 2013-06-17 ENCOUNTER — Ambulatory Visit (INDEPENDENT_AMBULATORY_CARE_PROVIDER_SITE_OTHER): Payer: Medicare Other

## 2013-06-17 ENCOUNTER — Ambulatory Visit: Payer: Medicare Other

## 2013-06-17 DIAGNOSIS — Z23 Encounter for immunization: Secondary | ICD-10-CM

## 2013-07-06 ENCOUNTER — Other Ambulatory Visit: Payer: Self-pay | Admitting: Family Medicine

## 2013-08-02 ENCOUNTER — Other Ambulatory Visit: Payer: Self-pay | Admitting: Family Medicine

## 2013-09-27 ENCOUNTER — Other Ambulatory Visit: Payer: Self-pay

## 2013-10-19 ENCOUNTER — Other Ambulatory Visit: Payer: Self-pay | Admitting: *Deleted

## 2013-10-19 MED ORDER — OMEPRAZOLE 20 MG PO CPDR: DELAYED_RELEASE_CAPSULE | ORAL | Status: DC

## 2013-11-01 ENCOUNTER — Other Ambulatory Visit (INDEPENDENT_AMBULATORY_CARE_PROVIDER_SITE_OTHER): Payer: Medicare Other

## 2013-11-01 DIAGNOSIS — Z13220 Encounter for screening for lipoid disorders: Secondary | ICD-10-CM

## 2013-11-01 DIAGNOSIS — Z125 Encounter for screening for malignant neoplasm of prostate: Secondary | ICD-10-CM

## 2013-11-01 DIAGNOSIS — R5381 Other malaise: Secondary | ICD-10-CM

## 2013-11-01 DIAGNOSIS — Z136 Encounter for screening for cardiovascular disorders: Secondary | ICD-10-CM

## 2013-11-01 DIAGNOSIS — R5383 Other fatigue: Secondary | ICD-10-CM

## 2013-11-01 LAB — CBC WITH DIFFERENTIAL/PLATELET
BASOS PCT: 0.6 % (ref 0.0–3.0)
Basophils Absolute: 0 10*3/uL (ref 0.0–0.1)
EOS PCT: 4.4 % (ref 0.0–5.0)
Eosinophils Absolute: 0.3 10*3/uL (ref 0.0–0.7)
HCT: 48.2 % (ref 39.0–52.0)
Hemoglobin: 16.2 g/dL (ref 13.0–17.0)
Lymphocytes Relative: 22.4 % (ref 12.0–46.0)
Lymphs Abs: 1.6 10*3/uL (ref 0.7–4.0)
MCHC: 33.7 g/dL (ref 30.0–36.0)
MCV: 96.9 fl (ref 78.0–100.0)
MONO ABS: 1 10*3/uL (ref 0.1–1.0)
MONOS PCT: 14 % — AB (ref 3.0–12.0)
NEUTROS PCT: 58.6 % (ref 43.0–77.0)
Neutro Abs: 4.2 10*3/uL (ref 1.4–7.7)
Platelets: 279 10*3/uL (ref 150.0–400.0)
RBC: 4.97 Mil/uL (ref 4.22–5.81)
RDW: 14.6 % (ref 11.5–14.6)
WBC: 7.2 10*3/uL (ref 4.5–10.5)

## 2013-11-01 LAB — LIPID PANEL
CHOL/HDL RATIO: 4
Cholesterol: 191 mg/dL (ref 0–200)
HDL: 43.5 mg/dL (ref >39.00)
LDL Cholesterol: 125 mg/dL — ABNORMAL HIGH (ref 0–99)
Triglycerides: 111 mg/dL (ref 0.0–149.0)
VLDL: 22.2 mg/dL (ref 0.0–40.0)

## 2013-11-01 LAB — BASIC METABOLIC PANEL
BUN: 8 mg/dL (ref 6–23)
CHLORIDE: 105 meq/L (ref 96–112)
CO2: 27 meq/L (ref 19–32)
Calcium: 9.5 mg/dL (ref 8.4–10.5)
Creatinine, Ser: 1 mg/dL (ref 0.4–1.5)
GFR: 76.55 mL/min (ref >60.00)
GLUCOSE: 98 mg/dL (ref 70–99)
POTASSIUM: 4.7 meq/L (ref 3.5–5.1)
Sodium: 141 mEq/L (ref 135–145)

## 2013-11-01 LAB — PSA, MEDICARE: PSA: 0.99 ng/ml (ref 0.10–4.00)

## 2013-11-01 LAB — HEPATIC FUNCTION PANEL
ALBUMIN: 4.2 g/dL (ref 3.5–5.2)
ALT: 29 U/L (ref 0–53)
AST: 23 U/L (ref 0–37)
Alkaline Phosphatase: 76 U/L (ref 39–117)
BILIRUBIN TOTAL: 1 mg/dL (ref 0.3–1.2)
Bilirubin, Direct: 0.2 mg/dL (ref 0.0–0.3)
Total Protein: 6.6 g/dL (ref 6.0–8.3)

## 2013-11-03 ENCOUNTER — Encounter: Payer: Medicare Other | Admitting: Family Medicine

## 2013-11-08 ENCOUNTER — Telehealth: Payer: Self-pay | Admitting: Family Medicine

## 2013-11-08 MED ORDER — FAMOTIDINE 20 MG PO TABS: ORAL_TABLET | ORAL | Status: DC

## 2013-11-08 NOTE — Telephone Encounter (Signed)
Pt had appointment scheduled last Wednesday and had to r/s because of death in the family. Pt is needing medication refill on Famoptidine 20 mg. Pt r/s to April and he will be out today. Pt uses [facility] on [address].

## 2013-11-08 NOTE — Telephone Encounter (Signed)
Patient notified prescription has been sent to his pharmacy.

## 2013-12-01 ENCOUNTER — Encounter: Payer: Self-pay | Admitting: Family Medicine

## 2013-12-01 ENCOUNTER — Ambulatory Visit (INDEPENDENT_AMBULATORY_CARE_PROVIDER_SITE_OTHER): Payer: Medicare Other | Admitting: Family Medicine

## 2013-12-01 VITALS — BP 130/86 | HR 71 | Temp 98.7°F | Ht 73.25 in | Wt 259.5 lb

## 2013-12-01 DIAGNOSIS — Z23 Encounter for immunization: Secondary | ICD-10-CM

## 2013-12-01 DIAGNOSIS — N529 Male erectile dysfunction, unspecified: Secondary | ICD-10-CM

## 2013-12-01 DIAGNOSIS — Z125 Encounter for screening for malignant neoplasm of prostate: Secondary | ICD-10-CM

## 2013-12-01 DIAGNOSIS — Z0000 Encounter for general adult medical examination without abnormal findings: Secondary | ICD-10-CM

## 2013-12-01 DIAGNOSIS — E669 Obesity, unspecified: Secondary | ICD-10-CM

## 2013-12-01 NOTE — Progress Notes (Signed)
Pre visit review using our clinic review tool, if applicable. No additional management support is needed unless otherwise documented below in the visit note. 

## 2013-12-01 NOTE — Progress Notes (Signed)
Date:  12/01/2013   Name:  Erik Edwards   DOB:  08/02/1946   MRN:  [id number] Gender: male Age: 68 y.o.  Primary Physician:  Owens Loffler, MD   Chief Complaint: Medicare Wellness   Subjective:   History of Present Illness:  Erik Edwards is a 68 y.o. pleasant patient who presents with the following:  Preventative Health Maintenance Visit / Medicare wellness:  Health Maintenance Summary Reviewed and updated, unless pt declines services.  Tobacco History Reviewed. Alcohol: No concerns, no excessive use Exercise Habits: not much now, rec at least 30 mins 5 times a week STD concerns: no risk or activity to increase risk Drug Use: None Encouraged self-testicular check  Health Maintenance  Topic Date Due  . Tetanus/tdap  10/12/1965  . Influenza Vaccine  03/26/2014  . Colonoscopy  08/27/2018  . Pneumococcal Polysaccharide Vaccine Age 67 And Over  Completed  . Zostavax  Completed    Immunization History  Administered Date(s) Administered  . Influenza, Seasonal, Injecte, Preservative Fre 07/27/2012  . Influenza,inj,Quad PF,36+ Mos 06/17/2013  . Pneumococcal Conjugate-13 12/01/2013  . Pneumococcal Polysaccharide-23 07/27/2012  . Zoster 07/27/2012    Patient Active Problem List   Diagnosis Date Noted  . GERD (gastroesophageal reflux disease) 12/02/2013  . Obesity (BMI 30-39.9) 12/02/2013  . Erectile dysfunction 12/02/2013  . FATIGUE 03/08/2010    Past Medical History  Diagnosis Date  . History of chicken pox   . GERD (gastroesophageal reflux disease) 12/02/2013    No past surgical history on file.  History   Social History  . Marital Status: Married    Spouse Name: N/A    Number of Children: 2  . Years of Education: N/A   Occupational History  . retired    Social History Main Topics  . Smoking status: Former Research scientist (life sciences)  . Smokeless tobacco: Never Used  . Alcohol Use: No  . Drug Use: No  . Sexual Activity: Not on file   Other Topics Concern  . Not  on file   Social History Narrative  . No narrative on file    Family History  Problem Relation Age of Onset  . Lymphoma Father     No Known Allergies  Medication list has been reviewed and updated.  Review of Systems:  General: Denies fever, chills, sweats. No significant weight loss. Eyes: Denies blurring,significant itching ENT: Denies earache, sore throat, and hoarseness. Cardiovascular: Denies chest pains, palpitations, dyspnea on exertion Respiratory: Denies cough, dyspnea at rest,wheeezing Breast: no concerns about lumps GI: Denies nausea, vomiting, diarrhea, constipation, change in bowel habits, abdominal pain, melena, hematochezia GU: Denies penile discharge, ED, urinary flow / outflow problems. No STD concerns. Musculoskeletal: Denies back pain, joint pain Derm: Denies rash, itching Neuro: Denies  paresthesias, frequent falls, frequent headaches Psych: Denies depression, anxiety Endocrine: Denies cold intolerance, heat intolerance, polydipsia Heme: Denies enlarged lymph nodes Allergy: No hayfever  Objective:   Physical Examination: BP 130/86  Pulse 71  Temp(Src) 98.7 F (37.1 C) (Oral)  Ht 6' 1.25" (1.861 m)  Wt 259 lb 8 oz (117.708 kg)  BMI 33.99 kg/m2 Ideal Body Weight: Weight in (lb) to have BMI = 25: 190.4  GEN: well developed, well nourished, no acute distress Eyes: conjunctiva and lids normal, PERRLA, EOMI ENT: TM clear, nares clear, oral exam WNL Neck: supple, no lymphadenopathy, no thyromegaly, no JVD Pulm: clear to auscultation and percussion, respiratory effort normal CV: regular rate and rhythm, S1-S2, no murmur, rub or gallop, no bruits, peripheral pulses  normal and symmetric, no cyanosis, clubbing, edema or varicosities GI: soft, non-tender; no hepatosplenomegaly, masses; active bowel sounds all quadrants GU: no hernia, testicular mass, penile discharge Lymph: no cervical, axillary or inguinal adenopathy MSK: gait normal, muscle tone and  strength WNL, no joint swelling, effusions, discoloration, crepitus  SKIN: clear, good turgor, color WNL, no rashes, lesions, or ulcerations Neuro: normal mental status, normal strength, sensation, and motion Psych: alert; oriented to person, place and time, normally interactive and not anxious or depressed in appearance.  All labs reviewed with patient.  Lipids:    Component Value Date/Time   CHOL 191 11/01/2013 0837   TRIG 111.0 11/01/2013 0837   HDL 43.50 11/01/2013 0837   LDLDIRECT 155.6 07/20/2012 1053   VLDL 22.2 11/01/2013 0837   CHOLHDL 4 11/01/2013 0837    CBC:    Component Value Date/Time   WBC 7.2 11/01/2013 0837   HGB 16.2 11/01/2013 0837   HCT 48.2 11/01/2013 0837   PLT 279.0 11/01/2013 0837   MCV 96.9 11/01/2013 0837   NEUTROABS 4.2 11/01/2013 0837   LYMPHSABS 1.6 11/01/2013 0837   MONOABS 1.0 11/01/2013 0837   EOSABS 0.3 11/01/2013 0837   BASOSABS 0.0 11/01/2013 2202    Basic Metabolic Panel:    Component Value Date/Time   NA 141 11/01/2013 0837   K 4.7 11/01/2013 0837   CL 105 11/01/2013 0837   CO2 27 11/01/2013 0837   BUN 8 11/01/2013 0837   CREATININE 1.0 11/01/2013 0837   GLUCOSE 98 11/01/2013 0837   CALCIUM 9.5 11/01/2013 0837    Lab Results  Component Value Date   ALT 29 11/01/2013   AST 23 11/01/2013   ALKPHOS 76 11/01/2013   BILITOT 1.0 11/01/2013    No results found for this basename: TSH    Lab Results  Component Value Date   PSA 0.99 11/01/2013   PSA 1.20 07/20/2012   PSA 1.19 03/08/2010    Assessment & Plan:   Health Maintenance Exam: The patient's preventative maintenance and recommended screening tests for an annual wellness exam were reviewed in full today. Brought up to date unless services declined.  Counselled on the importance of diet, exercise, and its role in overall health and mortality. The patient's FH and SH was reviewed, including their home life, tobacco status, and drug and alcohol status.  I have personally reviewed the Medicare Annual Wellness questionnaire  and have noted 1. The patient's medical and social history 2. Their use of alcohol, tobacco or illicit drugs 3. Their current medications and supplements 4. The patient's functional ability including ADL's, fall risks, home safety risks and hearing or visual             impairment. 5. Diet and physical activities 6. Evidence for depression or mood disorders  The patients weight, height, BMI and visual acuity have been recorded in the chart I have made referrals, counseling and provided education to the patient based review of the above and I have provided the pt with a written personalized care plan for preventive services.  I have provided the patient with a copy of your personalized plan for preventive services. Instructed to take the time to review along with their updated medication list.   Routine general medical examination at a health care facility  Need for prophylactic vaccination against Streptococcus pneumoniae (pneumococcus) - Plan: Pneumococcal conjugate vaccine 13-valent  Obesity (BMI 30-39.9)  Erectile dysfunction  Doing well overall. Weight loss.  Follow-up: No Follow-up on file.  New Prescriptions  No medications on file   Orders Placed This Encounter  Procedures  . Pneumococcal conjugate vaccine 13-valent   There are no Patient Instructions on file for this visit.  Signed,  Maud Deed. Andreea Arca, MD, [facility] [place] [address] Phone: [phone] Fax: [phone]  Patient's Medications  New Prescriptions   No medications on file  Previous Medications   ASPIRIN EC 81 MG TABLET    Take 81 mg by mouth every morning.   FAMOTIDINE (PEPCID) 20 MG TABLET    TAKE ONE TABLET BY MOUTH TWICE DAILY   MULTIPLE VITAMINS-MINERALS (MULTIVITAMINS THER. W/MINERALS) TABS    Take 1 tablet by mouth daily.   OMEPRAZOLE (PRILOSEC) 20 MG CAPSULE    TAKE ONE CAPSULE BY MOUTH EVERY DAY  Modified Medications     No medications on file  Discontinued Medications   No medications on file

## 2013-12-02 ENCOUNTER — Encounter: Payer: Self-pay | Admitting: Family Medicine

## 2013-12-02 ENCOUNTER — Telehealth: Payer: Self-pay

## 2013-12-02 DIAGNOSIS — E669 Obesity, unspecified: Secondary | ICD-10-CM | POA: Insufficient documentation

## 2013-12-02 DIAGNOSIS — K219 Gastro-esophageal reflux disease without esophagitis: Secondary | ICD-10-CM | POA: Insufficient documentation

## 2013-12-02 DIAGNOSIS — N529 Male erectile dysfunction, unspecified: Secondary | ICD-10-CM | POA: Insufficient documentation

## 2013-12-02 HISTORY — DX: Gastro-esophageal reflux disease without esophagitis: K21.9

## 2013-12-02 NOTE — Telephone Encounter (Signed)
Noted  

## 2013-12-02 NOTE — Telephone Encounter (Signed)
Pt was seen 12/01/13 and pt called back to report pt had normal colonoscopy on 05/26/2010 at [facility] center, ordered by Dr Earlean Shawl.

## 2014-01-24 ENCOUNTER — Other Ambulatory Visit: Payer: Self-pay | Admitting: Family Medicine

## 2014-02-03 ENCOUNTER — Other Ambulatory Visit: Payer: Self-pay | Admitting: Family Medicine

## 2014-06-14 ENCOUNTER — Ambulatory Visit (INDEPENDENT_AMBULATORY_CARE_PROVIDER_SITE_OTHER): Payer: Medicare Other

## 2014-06-14 DIAGNOSIS — Z23 Encounter for immunization: Secondary | ICD-10-CM

## 2014-07-25 ENCOUNTER — Other Ambulatory Visit: Payer: Self-pay | Admitting: Family Medicine

## 2014-08-01 ENCOUNTER — Other Ambulatory Visit: Payer: Self-pay | Admitting: Family Medicine

## 2014-11-04 ENCOUNTER — Other Ambulatory Visit: Payer: Self-pay | Admitting: *Deleted

## 2014-11-04 MED ORDER — OMEPRAZOLE 20 MG PO CPDR: 20.0000 mg | DELAYED_RELEASE_CAPSULE | Freq: Every day | ORAL | Status: DC

## 2014-11-04 MED ORDER — FAMOTIDINE 20 MG PO TABS: 20.0000 mg | ORAL_TABLET | Freq: Two times a day (BID) | ORAL | Status: DC

## 2015-02-19 ENCOUNTER — Other Ambulatory Visit: Payer: Self-pay | Admitting: Family Medicine

## 2015-02-20 NOTE — Telephone Encounter (Signed)
Called pt scheduled appointment for Medicare Wellness visit with fasting labs prior / lt

## 2015-02-20 NOTE — Telephone Encounter (Signed)
Please call and schedule CPE with fasting labs prior with Dr. Copland.  

## 2015-03-20 ENCOUNTER — Other Ambulatory Visit: Payer: Self-pay | Admitting: Family Medicine

## 2015-03-20 DIAGNOSIS — Z125 Encounter for screening for malignant neoplasm of prostate: Secondary | ICD-10-CM

## 2015-03-20 DIAGNOSIS — E785 Hyperlipidemia, unspecified: Secondary | ICD-10-CM

## 2015-03-20 DIAGNOSIS — Z79899 Other long term (current) drug therapy: Secondary | ICD-10-CM

## 2015-03-23 ENCOUNTER — Other Ambulatory Visit (INDEPENDENT_AMBULATORY_CARE_PROVIDER_SITE_OTHER): Payer: Medicare Other

## 2015-03-23 DIAGNOSIS — Z125 Encounter for screening for malignant neoplasm of prostate: Secondary | ICD-10-CM

## 2015-03-23 DIAGNOSIS — E785 Hyperlipidemia, unspecified: Secondary | ICD-10-CM

## 2015-03-23 DIAGNOSIS — Z79899 Other long term (current) drug therapy: Secondary | ICD-10-CM | POA: Diagnosis not present

## 2015-03-23 DIAGNOSIS — N529 Male erectile dysfunction, unspecified: Secondary | ICD-10-CM

## 2015-03-23 LAB — CBC WITH DIFFERENTIAL/PLATELET
Basophils Absolute: 0 10*3/uL (ref 0.0–0.1)
Basophils Relative: 0.4 % (ref 0.0–3.0)
Eosinophils Absolute: 0.3 10*3/uL (ref 0.0–0.7)
Eosinophils Relative: 4.4 % (ref 0.0–5.0)
HCT: 48.1 % (ref 39.0–52.0)
HEMOGLOBIN: 16.4 g/dL (ref 13.0–17.0)
Lymphocytes Relative: 23.9 % (ref 12.0–46.0)
Lymphs Abs: 1.6 10*3/uL (ref 0.7–4.0)
MCHC: 34.1 g/dL (ref 30.0–36.0)
MCV: 96.9 fl (ref 78.0–100.0)
MONO ABS: 0.9 10*3/uL (ref 0.1–1.0)
MONOS PCT: 12.9 % — AB (ref 3.0–12.0)
NEUTROS ABS: 4 10*3/uL (ref 1.4–7.7)
NEUTROS PCT: 58.4 % (ref 43.0–77.0)
Platelets: 263 10*3/uL (ref 150.0–400.0)
RBC: 4.96 Mil/uL (ref 4.22–5.81)
RDW: 14.1 % (ref 11.5–15.5)
WBC: 6.9 10*3/uL (ref 4.0–10.5)

## 2015-03-23 LAB — HEPATIC FUNCTION PANEL
ALBUMIN: 4.2 g/dL (ref 3.5–5.2)
ALT: 29 U/L (ref 0–53)
AST: 21 U/L (ref 0–37)
Alkaline Phosphatase: 67 U/L (ref 39–117)
Bilirubin, Direct: 0.2 mg/dL (ref 0.0–0.3)
Total Bilirubin: 0.9 mg/dL (ref 0.2–1.2)
Total Protein: 6.3 g/dL (ref 6.0–8.3)

## 2015-03-23 LAB — BASIC METABOLIC PANEL
BUN: 10 mg/dL (ref 6–23)
CHLORIDE: 104 meq/L (ref 96–112)
CO2: 30 mEq/L (ref 19–32)
Calcium: 9.3 mg/dL (ref 8.4–10.5)
Creatinine, Ser: 1.09 mg/dL (ref 0.40–1.50)
GFR: 71.41 mL/min (ref >60.00)
Glucose, Bld: 103 mg/dL — ABNORMAL HIGH (ref 70–99)
Potassium: 4.8 mEq/L (ref 3.5–5.1)
SODIUM: 140 meq/L (ref 135–145)

## 2015-03-23 LAB — LIPID PANEL
CHOL/HDL RATIO: 4
CHOLESTEROL: 186 mg/dL (ref 0–200)
HDL: 41.8 mg/dL (ref >39.00)
LDL CALC: 113 mg/dL — AB (ref 0–99)
NONHDL: 144
Triglycerides: 156 mg/dL — ABNORMAL HIGH (ref 0.0–149.0)
VLDL: 31.2 mg/dL (ref 0.0–40.0)

## 2015-03-23 LAB — PSA, MEDICARE: PSA: 1.04 ng/ml (ref 0.10–4.00)

## 2015-03-27 ENCOUNTER — Ambulatory Visit (INDEPENDENT_AMBULATORY_CARE_PROVIDER_SITE_OTHER): Payer: Medicare Other | Admitting: Family Medicine

## 2015-03-27 ENCOUNTER — Encounter: Payer: Self-pay | Admitting: Family Medicine

## 2015-03-27 VITALS — BP 130/76 | HR 79 | Temp 98.7°F | Ht 73.5 in | Wt 261.5 lb

## 2015-03-27 DIAGNOSIS — Z0000 Encounter for general adult medical examination without abnormal findings: Secondary | ICD-10-CM

## 2015-03-27 NOTE — Progress Notes (Signed)
Pre visit review using our clinic review tool, if applicable. No additional management support is needed unless otherwise documented below in the visit note. 

## 2015-03-27 NOTE — Progress Notes (Signed)
Dr. Frederico Hamman T. Matylda Fehring, MD, [facility] [place], [address] Phone: [phone] Fax: [phone]  03/27/2015  Patient: Erik Edwards, MRN: [id number], DOB: [birth year], 69 y.o.  Primary Physician:  Owens Loffler, MD  Chief Complaint: Annual Exam  Subjective:   Erik Edwards is a 69 y.o. pleasant patient who presents for a medicare wellness examination:  Preventative Health Maintenance Visit:  Health Maintenance Summary Reviewed and updated, unless pt declines services.  Tobacco History Reviewed. Alcohol: No concerns, no excessive use Exercise Habits: Some activity, rec at least 30 mins 5 times a week STD concerns: no risk or activity to increase risk Drug Use: None Encouraged self-testicular check  [facility] - got colon recall letter.    Multiple AK's - will call [facility] to get checked.    Health Maintenance  Topic Date Due  . TETANUS/TDAP  10/12/1965  . INFLUENZA VACCINE  03/26/2016 (Originally 03/27/2015)  . COLONOSCOPY  05/26/2020  . ZOSTAVAX  Completed  . PNA vac Low Risk Adult  Completed    Immunization History  Administered Date(s) Administered  . Influenza, Seasonal, Injecte, Preservative Fre 07/27/2012  . Influenza,inj,Quad PF,36+ Mos 06/17/2013, 06/14/2014  . Pneumococcal Conjugate-13 12/01/2013  . Pneumococcal Polysaccharide-23 07/27/2012  . Zoster 07/27/2012    Patient Active Problem List   Diagnosis Date Noted  . GERD (gastroesophageal reflux disease) 12/02/2013  . Obesity (BMI 30-39.9) 12/02/2013  . Erectile dysfunction 12/02/2013  . FATIGUE 03/08/2010   Past Medical History  Diagnosis Date  . History of chicken pox   . GERD (gastroesophageal reflux disease) 12/02/2013   No past surgical history on file. History   Social History  . Marital Status: Married    Spouse Name: N/A  . Number of Children: 2  . Years of Education: N/A   Occupational History  .  retired    Social History Main Topics  . Smoking status: Former Research scientist (life sciences)  . Smokeless tobacco: Never Used  . Alcohol Use: No  . Drug Use: No  . Sexual Activity: Not on file   Other Topics Concern  . Not on file   Social History Narrative   Family History  Problem Relation Age of Onset  . Lymphoma Father    No Known Allergies  Medication list has been reviewed and updated.   General: Denies fever, chills, sweats. No significant weight loss. Eyes: Denies blurring,significant itching ENT: Denies earache, sore throat, and hoarseness. Cardiovascular: Denies chest pains, palpitations, dyspnea on exertion Respiratory: Denies cough, dyspnea at rest,wheeezing Breast: no concerns about lumps GI: Denies nausea, vomiting, diarrhea, constipation, change in bowel habits, abdominal pain, melena, hematochezia GU: Denies penile discharge, ED, urinary flow / outflow problems. No STD concerns. Musculoskeletal: Denies back pain, joint pain Derm: SKIN CHANGES ON ARMS Neuro: Denies  paresthesias, frequent falls, frequent headaches Psych: Denies depression, anxiety Endocrine: Denies cold intolerance, heat intolerance, polydipsia Heme: Denies enlarged lymph nodes Allergy: No hayfever  Objective:   BP 130/76 mmHg  Pulse 79  Temp(Src) 98.7 F (37.1 C) (Oral)  Ht 6' 1.5" (1.867 m)  Wt 261 lb 8 oz (118.616 kg)  BMI 34.03 kg/m2  The patient completed a fall screen and PHQ-2 and PHQ-9 if necessary, which is documented in the EHR. The CMA/LPN/RN who assisted the patient verbally completed with them and documented results in [place].   Hearing Screening   Method: Audiometry   '125Hz'$  $Remo'250Hz'WunZm$'500Hz'$'1000Hz'$'2000Hz'$'4000Hz'$'8000Hz'$   Right ear:   40 40 20 0   Left ear:   25 40 20 0     Visual Acuity Screening   Right eye Left eye Both eyes  Without correction:     With correction: 20/20 20/20 20/20    GEN: well developed, well nourished, no acute distress Eyes: conjunctiva and lids  normal, PERRLA, EOMI ENT: TM clear, nares clear, oral exam WNL Neck: supple, no lymphadenopathy, no thyromegaly, no JVD Pulm: clear to auscultation and percussion, respiratory effort normal CV: regular rate and rhythm, S1-S2, no murmur, rub or gallop, no bruits, peripheral pulses normal and symmetric, no cyanosis, clubbing, edema or varicosities GI: soft, non-tender; no hepatosplenomegaly, masses; active bowel sounds all quadrants GU: no hernia, testicular mass, penile discharge Lymph: no cervical, axillary or inguinal adenopathy MSK: gait normal, muscle tone and strength WNL, no joint swelling, effusions, discoloration, crepitus  SKIN: MULTIPLE AK'S - ARMS, FEW AROUND EARS Neuro: normal mental status, normal strength, sensation, and motion Psych: alert; oriented to person, place and time, normally interactive and not anxious or depressed in appearance.  All labs reviewed with patient.  Lipids:    Component Value Date/Time   CHOL 186 03/23/2015 0837   TRIG 156.0* 03/23/2015 0837   HDL 41.80 03/23/2015 0837   LDLDIRECT 155.6 07/20/2012 1053   VLDL 31.2 03/23/2015 0837   CHOLHDL 4 03/23/2015 0837   CBC: CBC Latest Ref Rng 03/23/2015 11/01/2013 07/20/2012  WBC 4.0 - 10.5 K/uL 6.9 7.2 6.8  Hemoglobin 13.0 - 17.0 g/dL 16.4 16.2 16.0  Hematocrit 39.0 - 52.0 % 48.1 48.2 48.6  Platelets 150.0 - 400.0 K/uL 263.0 279.0 384.6    Basic Metabolic Panel:    Component Value Date/Time   NA 140 03/23/2015 0837   K 4.8 03/23/2015 0837   CL 104 03/23/2015 0837   CO2 30 03/23/2015 0837   BUN 10 03/23/2015 0837   CREATININE 1.09 03/23/2015 0837   GLUCOSE 103* 03/23/2015 0837   CALCIUM 9.3 03/23/2015 0837   Hepatic Function Latest Ref Rng 03/23/2015 11/01/2013 07/20/2012  Total Protein 6.0 - 8.3 g/dL 6.3 6.6 6.7  Albumin 3.5 - 5.2 g/dL 4.2 4.2 4.2  AST 0 - 37 U/L _0 ALT 0 - 53 U/L _1 Alk Phosphatase 39 - 117 U/L 67 76 61  Total Bilirubin 0.2 - 1.2 mg/dL 0.9 1.0 1.2  Bilirubin,  Direct 0.0 - 0.3 mg/dL 0.2 0.2 0.2    No results found for: TSH Lab Results  Component Value Date   PSA 1.04 03/23/2015   PSA 0.99 11/01/2013   PSA 1.20 07/20/2012    Assessment and Plan:   Health Maintenance Exam: The patient's preventative maintenance and recommended screening tests for an annual wellness exam were reviewed in full today. Brought up to date unless services declined.  Counselled on the importance of diet, exercise, and its role in overall health and mortality. The patient's FH and SH was reviewed, including their home life, tobacco status, and drug and alcohol status.  I have personally reviewed the Medicare Annual Wellness questionnaire and have noted 1. The patient's medical and social history 2. Their use of alcohol, tobacco or illicit drugs 3. Their current medications and supplements 4. The patient's functional ability including ADL's, fall risks, home safety risks and hearing or visual             impairment. 5. Diet and physical activities 6. Evidence for depression or mood disorders 7. Reviewed Updated provider  list, see scanned forms and [facility] Snapshot.   The patients weight, height, BMI and visual acuity have been recorded in the chart I have made referrals, counseling and provided education to the patient based review of the above and I have provided the pt with a written personalized care plan for preventive services.  I have provided the patient with a copy of your personalized plan for preventive services. Instructed to take the time to review along with their updated medication list.  He will call [facility] for colon  Follow-up: No Follow-up on file. Or follow-up in 1 year for complete physical examination   Signed,  Frederico Hamman T. Gunther Zawadzki, MD   Patient's Medications  New Prescriptions   No medications on file  Previous Medications   ASPIRIN EC 81 MG TABLET    Take 81 mg by mouth every morning.   FAMOTIDINE (PEPCID) 20 MG  TABLET    Take 1 tablet by mouth two  times daily   MULTIPLE VITAMINS-MINERALS (MULTIVITAMINS THER. W/MINERALS) TABS    Take 1 tablet by mouth daily.   OMEPRAZOLE (PRILOSEC) 20 MG CAPSULE    Take 1 capsule by mouth  daily  Modified Medications   No medications on file  Discontinued Medications   No medications on file

## 2015-05-10 ENCOUNTER — Other Ambulatory Visit: Payer: Self-pay | Admitting: Family Medicine

## 2015-06-12 DIAGNOSIS — K5730 Diverticulosis of large intestine without perforation or abscess without bleeding: Secondary | ICD-10-CM | POA: Diagnosis not present

## 2015-06-12 DIAGNOSIS — K641 Second degree hemorrhoids: Secondary | ICD-10-CM | POA: Diagnosis not present

## 2015-06-12 DIAGNOSIS — Z86010 Personal history of colonic polyps: Secondary | ICD-10-CM | POA: Diagnosis not present

## 2015-06-12 DIAGNOSIS — Z09 Encounter for follow-up examination after completed treatment for conditions other than malignant neoplasm: Secondary | ICD-10-CM | POA: Diagnosis not present

## 2015-06-12 LAB — HM COLONOSCOPY

## 2015-06-14 ENCOUNTER — Encounter: Payer: Self-pay | Admitting: Family Medicine

## 2015-06-27 ENCOUNTER — Ambulatory Visit (INDEPENDENT_AMBULATORY_CARE_PROVIDER_SITE_OTHER): Payer: Medicare Other

## 2015-06-27 DIAGNOSIS — Z23 Encounter for immunization: Secondary | ICD-10-CM

## 2015-11-07 DIAGNOSIS — L570 Actinic keratosis: Secondary | ICD-10-CM | POA: Diagnosis not present

## 2015-11-07 DIAGNOSIS — Z85828 Personal history of other malignant neoplasm of skin: Secondary | ICD-10-CM | POA: Diagnosis not present

## 2015-11-07 DIAGNOSIS — D1801 Hemangioma of skin and subcutaneous tissue: Secondary | ICD-10-CM | POA: Diagnosis not present

## 2015-11-07 DIAGNOSIS — C44229 Squamous cell carcinoma of skin of left ear and external auricular canal: Secondary | ICD-10-CM | POA: Diagnosis not present

## 2015-11-07 DIAGNOSIS — L565 Disseminated superficial actinic porokeratosis (DSAP): Secondary | ICD-10-CM | POA: Diagnosis not present

## 2015-11-07 DIAGNOSIS — L821 Other seborrheic keratosis: Secondary | ICD-10-CM | POA: Diagnosis not present

## 2016-01-03 DIAGNOSIS — K4090 Unilateral inguinal hernia, without obstruction or gangrene, not specified as recurrent: Secondary | ICD-10-CM

## 2016-01-03 HISTORY — DX: Unilateral inguinal hernia, without obstruction or gangrene, not specified as recurrent: K40.90

## 2016-02-26 ENCOUNTER — Ambulatory Visit (INDEPENDENT_AMBULATORY_CARE_PROVIDER_SITE_OTHER): Payer: Medicare Other | Admitting: Family Medicine

## 2016-02-26 ENCOUNTER — Encounter: Payer: Self-pay | Admitting: Family Medicine

## 2016-02-26 VITALS — BP 130/72 | HR 76 | Temp 98.6°F | Ht 73.5 in | Wt 254.2 lb

## 2016-02-26 DIAGNOSIS — R21 Rash and other nonspecific skin eruption: Secondary | ICD-10-CM

## 2016-02-26 DIAGNOSIS — K4090 Unilateral inguinal hernia, without obstruction or gangrene, not specified as recurrent: Secondary | ICD-10-CM | POA: Diagnosis not present

## 2016-02-26 DIAGNOSIS — N489 Disorder of penis, unspecified: Secondary | ICD-10-CM

## 2016-02-26 NOTE — Patient Instructions (Signed)
Clotrimazole cream twice a day

## 2016-02-26 NOTE — Progress Notes (Signed)
Pre visit review using our clinic review tool, if applicable. No additional management support is needed unless otherwise documented below in the visit note. 

## 2016-02-26 NOTE — Progress Notes (Signed)
Dr. Frederico Hamman T. Everette Mall, MD, [facility] [place], [address] Phone: [phone] Fax: [phone]  02/26/2016  Patient: Erik Edwards, MRN: [id number], DOB: [birth year], 70 y.o.  Primary Physician:  Owens Loffler, MD   Chief Complaint  Patient presents with  . Groin Pain    ?Hernia   Subjective:   Erik Edwards is a 70 y.o. very pleasant male patient who presents with the following:  Couple of weeks ago, noticed a slight pain in the graoin area. Thought maybe lifted something. Last Friday, felt a lump on the right side and then it was gone.  Some pressure but nothing is to bad.   He also within the last week has developed a purplish flat, nonpainful, non-itchy area on the head of his penis.  He has been monogamous with his wife for decades.  Past Medical History, Surgical History, Social History, Family History, Problem List, Medications, and Allergies have been reviewed and updated if relevant.  Patient Active Problem List   Diagnosis Date Noted  . GERD (gastroesophageal reflux disease) 12/02/2013  . Obesity (BMI 30-39.9) 12/02/2013  . Erectile dysfunction 12/02/2013  . FATIGUE 03/08/2010    Past Medical History  Diagnosis Date  . History of chicken pox   . GERD (gastroesophageal reflux disease) 12/02/2013    No past surgical history on file.  Social History   Social History  . Marital Status: Married    Spouse Name: N/A  . Number of Children: 2  . Years of Education: N/A   Occupational History  . retired    Social History Main Topics  . Smoking status: Former Research scientist (life sciences)  . Smokeless tobacco: Never Used  . Alcohol Use: No  . Drug Use: No  . Sexual Activity: Not on file   Other Topics Concern  . Not on file   Social History Narrative    Family History  Problem Relation Age of Onset  . Lymphoma Father     No Known Allergies  Medication list reviewed and updated in full in [place].   GEN: No acute illnesses, no fevers, chills. GI: No n/v/d, eating normally Pulm: No SOB Interactive and getting along well at home.  Otherwise, ROS is as per the HPI.  Objective:   BP 130/72 mmHg  Pulse 76  Temp(Src) 98.6 F (37 C) (Oral)  Ht 6' 1.5" (1.867 m)  Wt 254 lb 4 oz (115.327 kg)  BMI 33.09 kg/m2  GEN: WDWN, NAD, Non-toxic, A & O x 3 HEENT: Atraumatic, Normocephalic. Neck supple. No masses, No LAD. Ears and Nose: No external deformity. CV: RRR, No M/G/R. No JVD. No thrill. No extra heart sounds. PULM: CTA B, no wheezes, crackles, rhonchi. No retractions. No resp. distress. No accessory muscle use. EXTR: No c/c/e NEURO Normal gait.  PSYCH: Normally interactive. Conversant. Not depressed or anxious appearing.  Calm demeanor.   GU: normal uncircumcised male.  On the right inguinal canal there is a slight bulge to can be felt.  Slightly worse with coughing.  On the tip of the penis there is a area that is purplish approximately the size of a quarter.  Laboratory and Imaging Data:  Assessment and Plan:   Inguinal hernia, right  Penile rash  For now, he is just going to observe his hernia.  If it continues to bother some and it is troublesome and painful, he understands that he could have this addressed surgically an elective  manner.  Exact etiology of his 1 week.  I'll rash is unclear.  STD risks is extremely low.  For now, I suggest that he treat this as if it is balanitis, which can be common.  Chlortrimazole b.i.d.  If this does not resolve within 2 or 3 weeks, I asked him to call me, and we will likely need him to see dermatology.  Multiple other etiologies are possible.  Follow-up: No Follow-up on file.  Signed,  Maud Deed. Dwayne Begay, MD   Patient's Medications  New Prescriptions   No medications on file  Previous Medications   ASPIRIN EC 81 MG TABLET    Take 81 mg by mouth every morning.   FAMOTIDINE (PEPCID) 20 MG TABLET    Take 1 tablet by mouth  two  times daily   MULTIPLE VITAMINS-MINERALS (MULTIVITAMINS THER. W/MINERALS) TABS    Take 1 tablet by mouth daily.   OMEPRAZOLE (PRILOSEC) 20 MG CAPSULE    Take 1 capsule by mouth  daily  Modified Medications   No medications on file  Discontinued Medications   No medications on file

## 2016-05-18 ENCOUNTER — Other Ambulatory Visit: Payer: Self-pay | Admitting: Family Medicine

## 2016-06-30 ENCOUNTER — Emergency Department (HOSPITAL_COMMUNITY)
Admission: EM | Admit: 2016-06-30 | Discharge: 2016-07-01 | Disposition: A | Discharge status: Home / Self Care | Payer: Medicare Other | Attending: Emergency Medicine | Admitting: Emergency Medicine

## 2016-06-30 ENCOUNTER — Encounter (HOSPITAL_COMMUNITY): Payer: Self-pay | Admitting: Emergency Medicine

## 2016-06-30 DIAGNOSIS — S0031XA Abrasion of nose, initial encounter: Secondary | ICD-10-CM | POA: Diagnosis not present

## 2016-06-30 DIAGNOSIS — W1830XA Fall on same level, unspecified, initial encounter: Secondary | ICD-10-CM | POA: Diagnosis not present

## 2016-06-30 DIAGNOSIS — N3001 Acute cystitis with hematuria: Secondary | ICD-10-CM | POA: Diagnosis not present

## 2016-06-30 DIAGNOSIS — Y9301 Activity, walking, marching and hiking: Secondary | ICD-10-CM | POA: Insufficient documentation

## 2016-06-30 DIAGNOSIS — S069X9A Unspecified intracranial injury with loss of consciousness of unspecified duration, initial encounter: Secondary | ICD-10-CM | POA: Diagnosis not present

## 2016-06-30 DIAGNOSIS — Z23 Encounter for immunization: Secondary | ICD-10-CM | POA: Insufficient documentation

## 2016-06-30 DIAGNOSIS — Z87891 Personal history of nicotine dependence: Secondary | ICD-10-CM | POA: Diagnosis not present

## 2016-06-30 DIAGNOSIS — Y999 Unspecified external cause status: Secondary | ICD-10-CM | POA: Insufficient documentation

## 2016-06-30 DIAGNOSIS — R42 Dizziness and giddiness: Secondary | ICD-10-CM | POA: Diagnosis not present

## 2016-06-30 DIAGNOSIS — Y929 Unspecified place or not applicable: Secondary | ICD-10-CM | POA: Insufficient documentation

## 2016-06-30 DIAGNOSIS — Z7982 Long term (current) use of aspirin: Secondary | ICD-10-CM | POA: Insufficient documentation

## 2016-06-30 DIAGNOSIS — R404 Transient alteration of awareness: Secondary | ICD-10-CM | POA: Diagnosis not present

## 2016-06-30 DIAGNOSIS — S0992XA Unspecified injury of nose, initial encounter: Secondary | ICD-10-CM | POA: Diagnosis present

## 2016-06-30 DIAGNOSIS — R55 Syncope and collapse: Secondary | ICD-10-CM

## 2016-06-30 HISTORY — DX: Unilateral inguinal hernia, without obstruction or gangrene, not specified as recurrent: K40.90

## 2016-06-30 LAB — CBC
HEMATOCRIT: 46.2 % (ref 39.0–52.0)
HEMOGLOBIN: 15.7 g/dL (ref 13.0–17.0)
MCH: 33.1 pg (ref 26.0–34.0)
MCHC: 34 g/dL (ref 30.0–36.0)
MCV: 97.5 fL (ref 78.0–100.0)
Platelets: 237 10*3/uL (ref 150–400)
RBC: 4.74 MIL/uL (ref 4.22–5.81)
RDW: 13.5 % (ref 11.5–15.5)
WBC: 12.2 10*3/uL — ABNORMAL HIGH (ref 4.0–10.5)

## 2016-06-30 LAB — CBG MONITORING, ED: Glucose-Capillary: 108 mg/dL — ABNORMAL HIGH (ref 65–99)

## 2016-06-30 MED ORDER — TETANUS-DIPHTH-ACELL PERTUSSIS 5-2.5-18.5 LF-MCG/0.5 IM SUSP
0.5000 mL | Freq: Once | INTRAMUSCULAR | Status: AC
  Administered 2016-07-01: 0.5 mL via INTRAMUSCULAR
  Filled 2016-06-30: qty 0.5

## 2016-06-30 NOTE — ED Triage Notes (Signed)
Brought by ems for syncopal episode.  Reports sitting at desk feeling a little dizzy. Got up to walk into another room and became sweaty and passed out.  Small lac noted to bridge of nose from fall.  Denies having any pain at this time.  Does report lower abdominal pain last night and some today.  Feels like it is from his hernia.  [id number].

## 2016-06-30 NOTE — ED Provider Notes (Signed)
TIME SEEN: 11:23  CHIEF COMPLAINT: syncope  HPI:  Erik Edwards is a 70 year old male with history of GERD brought in by ambulance who presents to the Emergency Department complaining of sudden onset syncope tonight PTA. Pt states he was standing up from his computer chair when he felt lightheaded, but kept walking to another room and passed out. Per wife, he fell face first. He notes associated blurred vision right before passing out and diaphoresis when he woke up. No seizure-like activity. No alleviating or modifying factors noted.  Pt states he feels fine during the exam. Pt is unsure of his last tetanus shot. Pt takes aspirin daily, but is not on other blood thinners. He denies any HTN, DM, or cardiac hx.  Pt denies any chest pain, SOB, palpitations, headache, numbness, weakness, nausea, vomiting, diarrhea, melena or hematochezia.    ROS: See HPI Constitutional: no fever  Eyes: no drainage  ENT: no runny nose   Cardiovascular:  no chest pain  Resp: no SOB  GI: no vomiting GU: no dysuria Integumentary: no rash  Allergy: no hives  Musculoskeletal: no leg swelling  Neurological: no slurred speech ROS otherwise negative  PAST MEDICAL HISTORY/PAST SURGICAL HISTORY:  Past Medical History:  Diagnosis Date  . GERD (gastroesophageal reflux disease) 12/02/2013  . History of chicken pox   . Inguinal hernia     MEDICATIONS:  Prior to Admission medications   Medication Sig Start Date End Date Taking? Authorizing Provider  aspirin EC 81 MG tablet Take 81 mg by mouth every morning.    Historical Provider, MD  famotidine (PEPCID) 20 MG tablet Take 1 tablet by mouth two  times daily 05/19/16   Owens Loffler, MD  Multiple Vitamins-Minerals (MULTIVITAMINS THER. W/MINERALS) TABS Take 1 tablet by mouth daily.    Historical Provider, MD  omeprazole (PRILOSEC) 20 MG capsule Take 1 capsule by mouth  daily 05/19/16   Owens Loffler, MD    ALLERGIES:  No Known Allergies  SOCIAL HISTORY:  Social  History  Substance Use Topics  . Smoking status: Former Research scientist (life sciences)  . Smokeless tobacco: Never Used  . Alcohol use No    FAMILY HISTORY: Family History  Problem Relation Age of Onset  . Lymphoma Father     EXAM: BP 131/83 (BP Location: Right Arm)   Pulse 84   Temp 98.6 F (37 C) (Oral)   Resp 14   Ht 6\' 3"  (1.905 m)   Wt 252 lb (114.3 kg)   SpO2 98%   BMI 31.50 kg/m  CONSTITUTIONAL: Alert and oriented and responds appropriately to questions. Well-appearing; well-nourished; GCS 15 HEAD: Normocephalic; abrasions to his nose; no lacerations  EYES: Conjunctivae clear, PERRL, EOMI ENT: normal nose; no rhinorrhea; moist mucous membranes; pharynx without lesions noted; no dental injury; no septal hematoma, No bony tenderness on exam, midface is stable NECK: Supple, no meningismus, no LAD; no midline spinal tenderness, step-off or deformity CARD: RRR; S1 and S2 appreciated; no murmurs, no clicks, no rubs, no gallops RESP: Normal chest excursion without splinting or tachypnea; breath sounds clear and equal bilaterally; no wheezes, no rhonchi, no rales; no hypoxia or respiratory distress CHEST:  chest wall stable, no crepitus or ecchymosis or deformity, nontender to palpation ABD/GI: Normal bowel sounds; non-distended; soft, non-tender, no rebound, no guarding PELVIS:  stable, nontender to palpation BACK:  The back appears normal and is non-tender to palpation, there is no CVA tenderness; no midline spinal tenderness, step-off or deformity EXT: Normal ROM in all  joints; non-tender to palpation; no edema; normal capillary refill; no cyanosis, no bony tenderness or bony deformity of patient's extremities, no joint effusion, no ecchymosis or lacerations    SKIN: Normal color for age and race; warm NEURO: Moves all extremities equally, sensation to light touch intact diffusely, cranial nerves II through XII intact PSYCH: The patient's mood and manner are appropriate. Grooming and personal hygiene  are appropriate.  MEDICAL DECISION MAKING: Patient here with a syncopal event that occurred immediately after standing up. Did have some mild blurred vision right before passing out. Otherwise no associated prodrome. He has no current complaints. Has abrasions to his nose but no other sign of trauma on exam. Given his age and head trauma, will obtain a head CT.  We'll also obtain cardiac labs. EKG shows right bundle branch block that is new compared to prior but he did have some intraventricular conduction delay on his previous EKG in 2013. No other arrhythmia, interval changes or ischemic abnormalities. Orthostatic vital signs have been obtained and they are negative. He appears well hydrated today. He is requesting discharge home. Agrees to stay for workup.  ED PROGRESS: 2:00 AM  Pt's labs have been unremarkable other than mild leukocytosis which is likely reactive. He does appear to have a urinary tract infection. Culture is pending. His report he has had some lower abdominal pain over the past several days. Will discharge on Keflex. His troponin is negative. He has been able to drink, ambulate without difficulty. Head CT shows no acute abnormality. Still hemodynamically stable, neurologically intact. I recommended he increase his fluid intake at home and that when he stands up he stands there for several seconds before walking to ensure that he will not have another syncopal event. Have discussed with him and his wife at length that strict return precautions and they are comfortable with this plan for discharge home and outpatient follow-up.     At this time, I do not feel there is any life-threatening condition present. I have reviewed and discussed all results (EKG, imaging, lab, urine as appropriate), exam findings with patient/family. I have reviewed nursing notes and appropriate previous records.  I feel the patient is safe to be discharged home without further emergent workup and can continue  workup as an outpatient as needed. Discussed usual and customary return precautions. Patient/family verbalize understanding and are comfortable with this plan.  Outpatient follow-up has been provided. All questions have been answered.    I personally performed the services described in this documentation, which was scribed in my presence. The recorded information has been reviewed and is accurate.    [place], DO 07/01/16 [phone]

## 2016-07-01 ENCOUNTER — Emergency Department (HOSPITAL_COMMUNITY): Payer: Medicare Other

## 2016-07-01 DIAGNOSIS — S069X9A Unspecified intracranial injury with loss of consciousness of unspecified duration, initial encounter: Secondary | ICD-10-CM | POA: Diagnosis not present

## 2016-07-01 LAB — URINE MICROSCOPIC-ADD ON

## 2016-07-01 LAB — URINALYSIS, ROUTINE W REFLEX MICROSCOPIC
BILIRUBIN URINE: NEGATIVE
GLUCOSE, UA: NEGATIVE mg/dL
KETONES UR: NEGATIVE mg/dL
Nitrite: NEGATIVE
PROTEIN: NEGATIVE mg/dL
Specific Gravity, Urine: 1.019 (ref 1.005–1.030)
pH: 6 (ref 5.0–8.0)

## 2016-07-01 LAB — BASIC METABOLIC PANEL
Anion gap: 7 (ref 5–15)
BUN: 12 mg/dL (ref 6–20)
CALCIUM: 9.2 mg/dL (ref 8.9–10.3)
CO2: 26 mmol/L (ref 22–32)
CREATININE: 1.18 mg/dL (ref 0.61–1.24)
Chloride: 105 mmol/L (ref 101–111)
GFR calc Af Amer: >60 mL/min (ref >60)
GLUCOSE: 124 mg/dL — AB (ref 65–99)
Potassium: 4.3 mmol/L (ref 3.5–5.1)
SODIUM: 138 mmol/L (ref 135–145)

## 2016-07-01 LAB — I-STAT TROPONIN, ED: Troponin i, poc: 0 ng/mL (ref 0.00–0.08)

## 2016-07-01 MED ORDER — CEPHALEXIN 250 MG PO CAPS
500.0000 mg | ORAL_CAPSULE | Freq: Once | ORAL | Status: AC
  Administered 2016-07-01: 500 mg via ORAL
  Filled 2016-07-01: qty 2

## 2016-07-01 MED ORDER — CEPHALEXIN 500 MG PO CAPS: 500.0000 mg | ORAL_CAPSULE | Freq: Two times a day (BID) | ORAL | 0 refills | Status: DC

## 2016-07-01 NOTE — Discharge Instructions (Signed)
Please increase your water intake at home. When you stand up from a seated position, I recommend you stay and therefore several seconds before walking and to make sure you were asymptomatic. If you begin having chest pain or chest discomfort, shortness of breath, numbness or weakness on one side of your body, vomiting, blood in your stool or black and tarry stools, please return to the hospital. Please follow-up with your doctor next week.

## 2016-07-01 NOTE — ED Notes (Signed)
Ambulated in hall.  Steady gait noted.

## 2016-07-01 NOTE — ED Notes (Signed)
Taken to CT at this time. 

## 2016-07-02 LAB — URINE CULTURE: Culture: 30000 — AB

## 2016-07-08 ENCOUNTER — Ambulatory Visit (INDEPENDENT_AMBULATORY_CARE_PROVIDER_SITE_OTHER): Payer: Medicare Other | Admitting: Family Medicine

## 2016-07-08 ENCOUNTER — Encounter: Payer: Self-pay | Admitting: Family Medicine

## 2016-07-08 VITALS — BP 140/74 | HR 69 | Temp 98.8°F | Ht 73.5 in | Wt 262.2 lb

## 2016-07-08 DIAGNOSIS — Z23 Encounter for immunization: Secondary | ICD-10-CM

## 2016-07-08 DIAGNOSIS — R55 Syncope and collapse: Secondary | ICD-10-CM

## 2016-07-08 NOTE — Progress Notes (Signed)
Pre visit review using our clinic review tool, if applicable. No additional management support is needed unless otherwise documented below in the visit note. 

## 2016-07-08 NOTE — Progress Notes (Signed)
Dr. Frederico Hamman T. Laneshia Pina, MD, [facility] [place], [address] Phone: [phone] Fax: [phone]  07/08/2016  Patient: Erik Edwards, MRN: [id number], DOB: August 02, 1946, 70 y.o.  Primary Physician:  Owens Loffler, MD   Chief Complaint  Patient presents with  . Follow-up    [place]-Syncope & Cystits   Subjective:   Erik Edwards is a 70 y.o. very pleasant male patient who presents with the following:  Daughter and grandchildren, went to do some work on the computer. Felt himself get lightheaded and dizzy. Stood up and turned towards the hall and called nine one one.   Prior to this, he has been feeling very well, and they did do a workup in the ER that was quite extensive.  The patient had normal cardiac enzymes, his EKG was normal. He did have a slightly elevated white count and his urine was somewhat abnormal.  His culture results were inconclusive.  Since he got home from the ER, he has felt completely well and normal.  Past Medical History, Surgical History, Social History, Family History, Problem List, Medications, and Allergies have been reviewed and updated if relevant.  Patient Active Problem List   Diagnosis Date Noted  . GERD (gastroesophageal reflux disease) 12/02/2013  . Obesity (BMI 30-39.9) 12/02/2013  . Erectile dysfunction 12/02/2013  . FATIGUE 03/08/2010    Past Medical History:  Diagnosis Date  . GERD (gastroesophageal reflux disease) 12/02/2013  . History of chicken pox   . Inguinal hernia     No past surgical history on file.  Social History   Social History  . Marital status: Married    Spouse name: N/A  . Number of children: 2  . Years of education: N/A   Occupational History  . retired    Social History Main Topics  . Smoking status: Former Research scientist (life sciences)  . Smokeless tobacco: Never Used  . Alcohol use No  . Drug use: No  . Sexual activity: Not on file   Other Topics Concern  .  Not on file   Social History Narrative  . No narrative on file    Family History  Problem Relation Age of Onset  . Lymphoma Father     No Known Allergies  Medication list reviewed and updated in full in [facility].   GEN: No acute illnesses, no fevers, chills. GI: No n/v/d, eating normally Pulm: No SOB Interactive and getting along well at home.  Otherwise, ROS is as per the HPI.  Objective:   BP 140/74   Pulse 69   Temp 98.8 F (37.1 C) (Oral)   Ht 6' 1.5" (1.867 m)   Wt 262 lb 4 oz (119 kg)   BMI 34.13 kg/m   GEN: WDWN, NAD, Non-toxic, A & O x 3 HEENT: Atraumatic, Normocephalic. Neck supple. No masses, No LAD. Ears and Nose: No external deformity. CV: RRR, No M/G/R. No JVD. No thrill. No extra heart sounds. PULM: CTA B, no wheezes, crackles, rhonchi. No retractions. No resp. distress. No accessory muscle use. EXTR: No c/c/e NEURO Normal gait.  PSYCH: Normally interactive. Conversant. Not depressed or anxious appearing.  Calm demeanor.   Laboratory and Imaging Data: Results for orders placed or performed during the hospital encounter of 06/30/16  Urine culture  Result Value Ref Range   Specimen Description URINE, RANDOM    Special Requests ADDED [id number] 07/01/16    Culture (A)     30,000 COLONIES/mL  DIPHTHEROIDS(CORYNEBACTERIUM SPECIES) Standardized susceptibility testing for this organism is not available.    Report Status 07/02/2016 FINAL   Basic metabolic panel  Result Value Ref Range   Sodium 138 135 - 145 mmol/L   Potassium 4.3 3.5 - 5.1 mmol/L   Chloride 105 101 - 111 mmol/L   CO2 26 22 - 32 mmol/L   Glucose, Bld 124 (H) 65 - 99 mg/dL   BUN 12 6 - 20 mg/dL   Creatinine, Ser 1.18 0.61 - 1.24 mg/dL   Calcium 9.2 8.9 - 10.3 mg/dL   GFR calc non Af Amer >60 >60 mL/min   GFR calc Af Amer >60 >60 mL/min   Anion gap 7 5 - 15  CBC  Result Value Ref Range   WBC 12.2 (H) 4.0 - 10.5 K/uL   RBC 4.74 4.22 - 5.81 MIL/uL   Hemoglobin 15.7 13.0 - 17.0  g/dL   HCT 46.2 39.0 - 52.0 %   MCV 97.5 78.0 - 100.0 fL   MCH 33.1 26.0 - 34.0 pg   MCHC 34.0 30.0 - 36.0 g/dL   RDW 13.5 11.5 - 15.5 %   Platelets 237 150 - 400 K/uL  Urinalysis, Routine w reflex microscopic  Result Value Ref Range   Color, Urine YELLOW YELLOW   APPearance CLOUDY (A) CLEAR   Specific Gravity, Urine 1.019 1.005 - 1.030   pH 6.0 5.0 - 8.0   Glucose, UA NEGATIVE NEGATIVE mg/dL   Hgb urine dipstick MODERATE (A) NEGATIVE   Bilirubin Urine NEGATIVE NEGATIVE   Ketones, ur NEGATIVE NEGATIVE mg/dL   Protein, ur NEGATIVE NEGATIVE mg/dL   Nitrite NEGATIVE NEGATIVE   Leukocytes, UA LARGE (A) NEGATIVE  Urine microscopic-add on  Result Value Ref Range   Squamous Epithelial / LPF 0-5 (A) NONE SEEN   WBC, UA TOO NUMEROUS TO COUNT 0 - 5 WBC/hpf   RBC / HPF 6-30 0 - 5 RBC/hpf   Bacteria, UA MANY (A) NONE SEEN   Casts HYALINE CASTS (A) NEGATIVE   Urine-Other MUCOUS PRESENT   CBG monitoring, ED  Result Value Ref Range   Glucose-Capillary 108 (H) 65 - 99 mg/dL  I-stat troponin, ED  Result Value Ref Range   Troponin i, poc 0.00 0.00 - 0.08 ng/mL   Comment 3            Ct Head Wo Contrast  Result Date: 07/01/2016 CLINICAL DATA:  Syncope with loss of consciousness. Trauma to left side of head. EXAM: CT HEAD WITHOUT CONTRAST TECHNIQUE: Contiguous axial images were obtained from the base of the skull through the vertex without intravenous contrast. COMPARISON:  None. FINDINGS: BRAIN: Mild superficial atrophy with sulcal prominence. No intraparenchymal hemorrhage, mass effect nor midline shift. Patchy supratentorial white matter hypodensities within normal range for patient's age, though non-specific are most compatible with chronic small vessel ischemic disease. No acute large vascular territory infarcts. No abnormal extra-axial fluid collections. Basal cisterns are patent. VASCULAR: Moderate calcific atherosclerosis of the carotid siphons. SKULL: No skull fracture. No significant  scalp soft tissue swelling. SINUSES/ORBITS: The mastoid air-cells and included paranasal sinuses are well-aerated. Small mucous retention cyst in the anterior lateral left maxillary sinus.The included ocular globes and orbital contents are non-suspicious. OTHER: None. IMPRESSION: No acute intracranial abnormality.  Mild superficial atrophy. Electronically Signed   By: Ashley Royalty M.D.   On: 07/01/2016 00:55     Assessment and Plan:   Vaso vagal episode  Need for prophylactic vaccination and inoculation against influenza - Plan:  Flu Vaccine QUAD 36+ mos IM  Syncope and collapse  ,>25 minutes spent in face to face time with patient, >50% spent in counselling or coordination of care  At this point, this sounds like a fairly classic vasovagal response and where he got presyncopal and then syncopal and collapsed.  He probably recovered and had no abnormality significantly other than a possible UTI seen in the ER.  He is taking antibiotics, and he feels perfectly well and has been for many days.  I think at this point we can follow up only as needed.  I did give him specific instructions if he has anything similar to this in any way and he needs to come back and we will pursue this more aggressively.  Follow-up: No Follow-up on file.  Medications Discontinued During This Encounter  Medication Reason  . cephALEXin (KEFLEX) 500 MG capsule Completed Course   Orders Placed This Encounter  Procedures  . Flu Vaccine QUAD 36+ mos IM    Signed,  Timm Bonenberger T. Tariana Moldovan, MD     Medication List       Accurate as of 07/08/16 11:59 PM. Always use your most recent med list.          aspirin EC 81 MG tablet Take 81 mg by mouth every morning.   famotidine 20 MG tablet Commonly known as:  PEPCID Take 1 tablet by mouth two  times daily   multivitamins ther. w/minerals Tabs tablet Take 1 tablet by mouth daily.   omeprazole 20 MG capsule Commonly known as:  PRILOSEC Take 1 capsule by mouth   daily

## 2016-09-26 ENCOUNTER — Encounter: Payer: Self-pay | Admitting: Family Medicine

## 2016-09-26 ENCOUNTER — Ambulatory Visit (INDEPENDENT_AMBULATORY_CARE_PROVIDER_SITE_OTHER): Payer: Medicare Other | Admitting: Family Medicine

## 2016-09-26 VITALS — BP 140/84 | HR 79 | Temp 99.1°F | Ht 73.5 in | Wt 262.2 lb

## 2016-09-26 DIAGNOSIS — K5732 Diverticulitis of large intestine without perforation or abscess without bleeding: Secondary | ICD-10-CM

## 2016-09-26 DIAGNOSIS — R1030 Lower abdominal pain, unspecified: Secondary | ICD-10-CM | POA: Diagnosis not present

## 2016-09-26 LAB — POC URINALSYSI DIPSTICK (AUTOMATED)
Bilirubin, UA: NEGATIVE
Blood, UA: NEGATIVE
Glucose, UA: NEGATIVE
KETONES UA: NEGATIVE
LEUKOCYTES UA: NEGATIVE
Nitrite, UA: NEGATIVE
PH UA: 7.5
PROTEIN UA: NEGATIVE
SPEC GRAV UA: 1.015
Urobilinogen, UA: 0.2

## 2016-09-26 MED ORDER — AMOXICILLIN-POT CLAVULANATE 875-125 MG PO TABS: 1.0000 | ORAL_TABLET | Freq: Two times a day (BID) | ORAL | 0 refills | Status: AC

## 2016-09-26 NOTE — Progress Notes (Signed)
Dr. Frederico Hamman T. Jenness Stemler, MD, [facility] [place], [address] Phone: [phone] Fax: [phone]  09/26/2016  Patient: Erik Edwards, MRN: [id number], DOB: Sep 28, 1945, 71 y.o.  Primary Physician:  Owens Loffler, MD   Chief Complaint  Patient presents with  . Abdominal Pain    Lower Abdomen   Subjective:   Erik Edwards is a 71 y.o. very pleasant male patient who presents with the following:  Diverticulitis  Had a UTI back in November. Had some discomfort down in his lower abdomen. On Saturday, pain and really bad and had to sit in the recliner.   At that point, the pain was more diffuse and in both lower quadrants, but now it is more localized to the left lower quadrant. He has been able to eat and drink, but his intake has been decreased. He also has some slight constipation which resolved with some stool softeners.  He has not been febrile and not had any blood in stool.  Past Medical History, Surgical History, Social History, Family History, Problem List, Medications, and Allergies have been reviewed and updated if relevant.  Patient Active Problem List   Diagnosis Date Noted  . GERD (gastroesophageal reflux disease) 12/02/2013  . Obesity (BMI 30-39.9) 12/02/2013  . Erectile dysfunction 12/02/2013  . FATIGUE 03/08/2010    Past Medical History:  Diagnosis Date  . GERD (gastroesophageal reflux disease) 12/02/2013  . History of chicken pox   . Inguinal hernia     No past surgical history on file.  Social History   Social History  . Marital status: Married    Spouse name: N/A  . Number of children: 2  . Years of education: N/A   Occupational History  . retired    Social History Main Topics  . Smoking status: Former Research scientist (life sciences)  . Smokeless tobacco: Never Used  . Alcohol use No  . Drug use: No  . Sexual activity: Not on file   Other Topics Concern  . Not on file   Social History Narrative  .  No narrative on file    Family History  Problem Relation Age of Onset  . Lymphoma Father     No Known Allergies  Medication list reviewed and updated in full in [facility].  ROS: GEN: Acute illness details above GI: Tolerating PO intake GU: maintaining adequate hydration and urination Pulm: No SOB Interactive and getting along well at home.  Otherwise, ROS is as per the HPI.  Objective:   BP 140/84   Pulse 79   Temp 99.1 F (37.3 C) (Oral)   Ht 6' 1.5" (1.867 m)   Wt 262 lb 4 oz (119 kg)   BMI 34.13 kg/m   GEN: WDWN, NAD, Non-toxic, A & O x 3 HEENT: Atraumatic, Normocephalic. Neck supple. No masses, No LAD. Ears and Nose: No external deformity. CV: RRR, No M/G/R. No JVD. No thrill. No extra heart sounds. PULM: CTA B, no wheezes, crackles, rhonchi. No retractions. No resp. distress. No accessory muscle use. ABD: S, mild ttp LLQ, ND, +BS. No rebound. No HSM. EXTR: No c/c/e NEURO Normal gait.  PSYCH: Normally interactive. Conversant. Not depressed or anxious appearing.  Calm demeanor.     Laboratory and Imaging Data: Results for orders placed or performed in visit on 09/26/16  POCT Urinalysis Dipstick (Automated)  Result Value Ref Range   Color, UA yellow    Clarity, UA clear    Glucose,  UA negative    Bilirubin, UA negative    Ketones, UA negative    Spec Grav, UA 1.015    Blood, UA negative    pH, UA 7.5    Protein, UA negatuve    Urobilinogen, UA 0.2    Nitrite, UA negative    Leukocytes, UA Negative Negative     Assessment and Plan:   Diverticulitis of large intestine without bleeding, unspecified complication status  Lower abdominal pain - Plan: POCT Urinalysis Dipstick (Automated)  By history and examination, consistent with diverticulitis of the left lower quadrant. We will treat this as such. Reviewed with the patient.  Follow-up: No Follow-up on file.  Meds ordered this encounter  Medications  . amoxicillin-clavulanate (AUGMENTIN)  875-125 MG tablet    Sig: Take 1 tablet by mouth 2 (two) times daily.    Dispense:  20 tablet    Refill:  0   There are no discontinued medications. Orders Placed This Encounter  Procedures  . POCT Urinalysis Dipstick (Automated)    Signed,  Islah Eve T. Cheston Coury, MD   Allergies as of 09/26/2016   No Known Allergies     Medication List       Accurate as of 09/26/16  1:59 PM. Always use your most recent med list.          amoxicillin-clavulanate 875-125 MG tablet Commonly known as:  AUGMENTIN Take 1 tablet by mouth 2 (two) times daily.   aspirin EC 81 MG tablet Take 81 mg by mouth every morning.   famotidine 20 MG tablet Commonly known as:  PEPCID Take 1 tablet by mouth two  times daily   multivitamins ther. w/minerals Tabs tablet Take 1 tablet by mouth daily.   omeprazole 20 MG capsule Commonly known as:  PRILOSEC Take 1 capsule by mouth  daily

## 2016-09-26 NOTE — Progress Notes (Signed)
Pre visit review using our clinic review tool, if applicable. No additional management support is needed unless otherwise documented below in the visit note. 

## 2016-11-05 ENCOUNTER — Encounter (HOSPITAL_COMMUNITY): Payer: Self-pay

## 2016-11-05 ENCOUNTER — Emergency Department (HOSPITAL_COMMUNITY)
Admission: EM | Admit: 2016-11-05 | Discharge: 2016-11-06 | Disposition: A | Discharge status: Home / Self Care | Payer: Medicare Other | Attending: Emergency Medicine | Admitting: Emergency Medicine

## 2016-11-05 DIAGNOSIS — K4091 Unilateral inguinal hernia, without obstruction or gangrene, recurrent: Secondary | ICD-10-CM | POA: Diagnosis not present

## 2016-11-05 DIAGNOSIS — K8020 Calculus of gallbladder without cholecystitis without obstruction: Secondary | ICD-10-CM | POA: Diagnosis not present

## 2016-11-05 DIAGNOSIS — K4021 Bilateral inguinal hernia, without obstruction or gangrene, recurrent: Secondary | ICD-10-CM | POA: Diagnosis not present

## 2016-11-05 DIAGNOSIS — R1032 Left lower quadrant pain: Secondary | ICD-10-CM | POA: Diagnosis not present

## 2016-11-05 DIAGNOSIS — K5730 Diverticulosis of large intestine without perforation or abscess without bleeding: Secondary | ICD-10-CM | POA: Diagnosis not present

## 2016-11-05 DIAGNOSIS — Z87891 Personal history of nicotine dependence: Secondary | ICD-10-CM | POA: Insufficient documentation

## 2016-11-05 DIAGNOSIS — Z7982 Long term (current) use of aspirin: Secondary | ICD-10-CM | POA: Insufficient documentation

## 2016-11-05 HISTORY — DX: Diverticulosis of intestine, part unspecified, without perforation or abscess without bleeding: K57.90

## 2016-11-05 HISTORY — DX: Urinary tract infection, site not specified: N39.0

## 2016-11-05 LAB — COMPREHENSIVE METABOLIC PANEL
ALBUMIN: 4.4 g/dL (ref 3.5–5.0)
ALT: 33 U/L (ref 17–63)
AST: 30 U/L (ref 15–41)
Alkaline Phosphatase: 61 U/L (ref 38–126)
Anion gap: 10 (ref 5–15)
BUN: 7 mg/dL (ref 6–20)
CHLORIDE: 104 mmol/L (ref 101–111)
CO2: 25 mmol/L (ref 22–32)
Calcium: 9.2 mg/dL (ref 8.9–10.3)
Creatinine, Ser: 1.11 mg/dL (ref 0.61–1.24)
GFR calc Af Amer: >60 mL/min (ref >60)
GFR calc non Af Amer: >60 mL/min (ref >60)
GLUCOSE: 132 mg/dL — AB (ref 65–99)
POTASSIUM: 4.1 mmol/L (ref 3.5–5.1)
SODIUM: 139 mmol/L (ref 135–145)
Total Bilirubin: 1.2 mg/dL (ref 0.3–1.2)
Total Protein: 6.8 g/dL (ref 6.5–8.1)

## 2016-11-05 LAB — CBC
HEMATOCRIT: 48 % (ref 39.0–52.0)
Hemoglobin: 16.2 g/dL (ref 13.0–17.0)
MCH: 32.9 pg (ref 26.0–34.0)
MCHC: 33.8 g/dL (ref 30.0–36.0)
MCV: 97.6 fL (ref 78.0–100.0)
Platelets: 266 10*3/uL (ref 150–400)
RBC: 4.92 MIL/uL (ref 4.22–5.81)
RDW: 13.6 % (ref 11.5–15.5)
WBC: 12.9 10*3/uL — AB (ref 4.0–10.5)

## 2016-11-05 LAB — URINALYSIS, ROUTINE W REFLEX MICROSCOPIC
Bilirubin Urine: NEGATIVE
GLUCOSE, UA: NEGATIVE mg/dL
Hgb urine dipstick: NEGATIVE
KETONES UR: 20 mg/dL — AB
LEUKOCYTES UA: NEGATIVE
Nitrite: NEGATIVE
PH: 5 (ref 5.0–8.0)
Protein, ur: NEGATIVE mg/dL
Specific Gravity, Urine: 1.021 (ref 1.005–1.030)

## 2016-11-05 LAB — LIPASE, BLOOD: LIPASE: 13 U/L (ref 11–51)

## 2016-11-05 NOTE — ED Triage Notes (Signed)
Pt states he started having severe abdominal pain today; pt states he has hx but in normally goes away; pt states sharp pain at 8/10 on arrival. Pt states nausea but denies vomiting; Pt a&ox 4 on arrival. Pt denies urinary issues currently.

## 2016-11-06 ENCOUNTER — Encounter (HOSPITAL_COMMUNITY): Payer: Self-pay | Admitting: Radiology

## 2016-11-06 ENCOUNTER — Emergency Department (HOSPITAL_COMMUNITY): Payer: Medicare Other

## 2016-11-06 DIAGNOSIS — K5730 Diverticulosis of large intestine without perforation or abscess without bleeding: Secondary | ICD-10-CM | POA: Diagnosis not present

## 2016-11-06 MED ORDER — METRONIDAZOLE 500 MG PO TABS: 500.0000 mg | ORAL_TABLET | Freq: Two times a day (BID) | ORAL | 0 refills | Status: DC

## 2016-11-06 MED ORDER — IOPAMIDOL (ISOVUE-300) INJECTION 61%
INTRAVENOUS | Status: AC
  Administered 2016-11-06: 100 mL
  Filled 2016-11-06: qty 100

## 2016-11-06 MED ORDER — CIPROFLOXACIN HCL 500 MG PO TABS: 500.0000 mg | ORAL_TABLET | Freq: Two times a day (BID) | ORAL | 0 refills | Status: DC

## 2016-11-06 NOTE — ED Notes (Signed)
Patient transported to CT 

## 2016-11-06 NOTE — ED Provider Notes (Signed)
[facility] DEPT Provider Note   CSN: [id number] Arrival date & time: 11/05/16  2004   By signing my name below, I, Delton Prairie, attest that this documentation has been prepared under the direction and in the presence of Ripley Fraise, MD  Electronically Signed: Delton Prairie, ED Scribe. 11/06/16. 12:35 AM.   History   Chief Complaint Chief Complaint  Patient presents with  . Abdominal Pain   The history is provided by the patient. No language interpreter was used.  Abdominal Pain   This is a new problem. The current episode started 6 to 12 hours ago. The problem has been gradually improving. The pain is associated with an unknown factor. The pain is located in the LLQ. The pain is severe. Associated symptoms include nausea. Pertinent negatives include diarrhea, hematochezia, vomiting, constipation and dysuria. Nothing aggravates the symptoms. Nothing relieves the symptoms. His past medical history is significant for GERD.   HPI Comments:  Erik Edwards is a 71 y.o. male, with a PMHx of diverticulosis and GERD, who presents to the Emergency Department complaining of an episode of acute onset, severe abdominal pain which began around 5 PM today. He notes his pain lasted for an hour and gradually improved. He also reports associated nausea, diaphoresis and a hx of similar symptoms which self resolve. No alleviating factors noted. Pt denies vomiting, chest pain, SOB, LOC, diarrhea, constipation, hematochezia, dysuria or a hx of abdominal surgeries. No other associated symptoms noted.   Past Medical History:  Diagnosis Date  . Diverticulosis   . GERD (gastroesophageal reflux disease) 12/02/2013  . History of chicken pox   . Inguinal hernia   . UTI (urinary tract infection)     Patient Active Problem List   Diagnosis Date Noted  . GERD (gastroesophageal reflux disease) 12/02/2013  . Obesity (BMI 30-39.9) 12/02/2013  . Erectile dysfunction 12/02/2013  . FATIGUE 03/08/2010     History reviewed. No pertinent surgical history.     Home Medications    Prior to Admission medications   Medication Sig Start Date End Date Taking? Authorizing Provider  aspirin EC 81 MG tablet Take 81 mg by mouth every morning.    Historical Provider, MD  famotidine (PEPCID) 20 MG tablet Take 1 tablet by mouth two  times daily 05/19/16   Owens Loffler, MD  Multiple Vitamins-Minerals (MULTIVITAMINS THER. W/MINERALS) TABS Take 1 tablet by mouth daily.    Historical Provider, MD  omeprazole (PRILOSEC) 20 MG capsule Take 1 capsule by mouth  daily 05/19/16   Owens Loffler, MD    Family History Family History  Problem Relation Age of Onset  . Lymphoma Father     Social History Social History  Substance Use Topics  . Smoking status: Former Research scientist (life sciences)  . Smokeless tobacco: Never Used  . Alcohol use No     Allergies   Patient has no known allergies.   Review of Systems Review of Systems  Constitutional: Positive for diaphoresis.  Respiratory: Negative for shortness of breath.   Cardiovascular: Negative for chest pain.  Gastrointestinal: Positive for abdominal pain and nausea. Negative for blood in stool, constipation, diarrhea, hematochezia and vomiting.  Genitourinary: Negative for dysuria.  Neurological: Negative for syncope.  All other systems reviewed and are negative.  Physical Exam Updated Vital Signs BP 148/84 (BP Location: Left Arm)   Pulse 72   Temp 98.7 F (37.1 C) (Oral)   Resp 20   SpO2 96%   Physical Exam  CONSTITUTIONAL: Well developed/well nourished HEAD: Normocephalic/atraumatic EYES:  EOMI/PERRL ENMT: Mucous membranes moist NECK: supple no meningeal signs SPINE/BACK:entire spine nontender CV: S1/S2 noted, no murmurs/rubs/gallops noted LUNGS: Lungs are clear to auscultation bilaterally, no apparent distress ABDOMEN: soft, mild LLQ tenderness, no rebound or guarding, bowel sounds noted throughout abdomen GU:no cva tenderness. No inguinal  hernia, no scrotal tenderness, chaperone present NEURO: Pt is awake/alert/appropriate, moves all extremitiesx4.  No facial droop.   EXTREMITIES: pulses normal/equal, full ROM SKIN: warm, color normal PSYCH: no abnormalities of mood noted, alert and oriented to situation  ED Treatments / Results  DIAGNOSTIC STUDIES:  Oxygen Saturation is 96% on RA, normal by my interpretation.    COORDINATION OF CARE:  12:33 AM Discussed treatment plan with pt at bedside and pt agreed to plan.  Labs (all labs ordered are listed, but only abnormal results are displayed) Labs Reviewed  COMPREHENSIVE METABOLIC PANEL - Abnormal; Notable for the following:       Result Value   Glucose, Bld 132 (*)    All other components within normal limits  CBC - Abnormal; Notable for the following:    WBC 12.9 (*)    All other components within normal limits  URINALYSIS, ROUTINE W REFLEX MICROSCOPIC - Abnormal; Notable for the following:    APPearance HAZY (*)    Ketones, ur 20 (*)    All other components within normal limits  LIPASE, BLOOD    EKG  EKG Interpretation None       Radiology Ct Abdomen Pelvis W Contrast  Result Date: 11/06/2016 CLINICAL DATA:  71 year old male with left lower quadrant abdominal pain. EXAM: CT ABDOMEN AND PELVIS WITH CONTRAST TECHNIQUE: Multidetector CT imaging of the abdomen and pelvis was performed using the standard protocol following bolus administration of intravenous contrast. CONTRAST:  1 ISOVUE-300 IOPAMIDOL (ISOVUE-300) INJECTION 61% COMPARISON:  None. FINDINGS: Lower chest: The visualized lung bases are clear. No intra-abdominal free air or free fluid. Hepatobiliary: Probable mild fatty infiltration of the liver. No intrahepatic biliary ductal dilatation. There is a calcified stone within the gallbladder. No pericholecystic fluid or evidence of acute cholecystitis by CT. Pancreas: Unremarkable. No pancreatic ductal dilatation or surrounding inflammatory changes. Spleen:  Normal in size without focal abnormality. Adrenals/Urinary Tract: The adrenal glands appear unremarkable. Small exophytic bilateral renal hypodense lesions are poorly characterized due to small size but most likely represent cysts. There is no hydronephrosis on either side. The visualized ureters and urinary bladder appear unremarkable. Stomach/Bowel: There is sigmoid diverticulosis without active inflammatory changes. There is no evidence of bowel obstruction or active inflammation. Normal appendix. Vascular/Lymphatic: There is moderate aortoiliac atherosclerotic disease. No aneurysmal dilatation or evidence of dissection. The origins of the celiac axis, SMA, IMA as well as the origins of the renal arteries are patent. The SMV, splenic vein, and main portal vein are patent. No portal venous gas identified. There is no adenopathy. Reproductive: Mild enlargement of the prostate gland measuring up to 5.5 cm in transverse diameter. There is heterogeneity with central areas of lower attenuation. The seminal vesicles are grossly unremarkable. Other: Small bilateral fat containing inguinal hernias. There is slight protrusion of a loop of small bowel into the right inguinal region with stranding of the fat at the level of the right inguinal ligament concerning for a degree of strangulation. No fluid collection. Musculoskeletal: There is degenerative changes of the spine with disc space narrowing and endplate irregularity most prominent at L2-L3 and L5-S1. Lower lumbar facet arthropathy. No acute fracture. IMPRESSION: 1. Small bilateral fat containing inguinal hernia. There is  mild stranding of the fat in the right hemipelvis at the right inguinal ligament concerning for a degree of strangulation or incarceration of the herniated fat in the right inguinal canal. Clinical correlation is recommended. No fluid collection. 2. Sigmoid diverticulosis without active inflammatory changes. No evidence of bowel obstruction. Normal  appendix. 3. Cholelithiasis. 4. Probable mild fatty liver. 5. Atherosclerotic calcification of the aorta. 6. Mild enlargement of the prostate gland. Electronically Signed   By: Anner Crete M.D.   On: 11/06/2016 01:30    Procedures Procedures (including critical care time)  Medications Ordered in ED Medications - No data to display   Initial Impression / Assessment and Plan / ED Course  I have reviewed the triage vital signs and the nursing notes.  Pertinent labs & imaging results that were available during my care of the patient were reviewed by me and considered in my medical decision making (see chart for details).    1:56 AM Pt well appearing ?bilatera fat containing hernias, but on repeat exam there is no palpable abdominal wall hernia or inguinal hernia. No skin changes or discoloration  He is well appearing.  He only has mild LLQ tenderness Discussed CT findings with dr Rosendo Gros with surgery - if no tenderness or outward signs of hernia no acute management required  Pt safe for d/c home Due to LLQ pain and diverticulosis will treat as possible early diverticulitis  He is well appearing/nontoxic, smiling and appropriate He is agreeable with plan We discussed strict ER return precautions  Final Clinical Impressions(s) / ED Diagnoses   Final diagnoses:  LLQ abdominal pain  Bilateral recurrent inguinal hernia without obstruction or gangrene  Calculus of gallbladder without cholecystitis without obstruction    New Prescriptions New Prescriptions   CIPROFLOXACIN (CIPRO) 500 MG TABLET    Take 1 tablet (500 mg total) by mouth 2 (two) times daily. One po bid x 7 days   METRONIDAZOLE (FLAGYL) 500 MG TABLET    Take 1 tablet (500 mg total) by mouth 2 (two) times daily. One po bid x 7 days  I personally performed the services described in this documentation, which was scribed in my presence. The recorded information has been reviewed and is accurate.        Ripley Fraise,  MD 11/06/16 [phone]

## 2016-11-10 ENCOUNTER — Other Ambulatory Visit: Payer: Self-pay | Admitting: Family Medicine

## 2016-11-20 ENCOUNTER — Ambulatory Visit (INDEPENDENT_AMBULATORY_CARE_PROVIDER_SITE_OTHER): Payer: Medicare Other | Admitting: Family Medicine

## 2016-11-20 ENCOUNTER — Encounter: Payer: Self-pay | Admitting: Family Medicine

## 2016-11-20 ENCOUNTER — Ambulatory Visit (INDEPENDENT_AMBULATORY_CARE_PROVIDER_SITE_OTHER): Payer: Medicare Other

## 2016-11-20 VITALS — BP 134/86 | HR 76 | Temp 98.3°F | Ht 73.0 in | Wt 256.2 lb

## 2016-11-20 DIAGNOSIS — Z1159 Encounter for screening for other viral diseases: Secondary | ICD-10-CM

## 2016-11-20 DIAGNOSIS — K4020 Bilateral inguinal hernia, without obstruction or gangrene, not specified as recurrent: Secondary | ICD-10-CM

## 2016-11-20 DIAGNOSIS — Z0000 Encounter for general adult medical examination without abnormal findings: Secondary | ICD-10-CM

## 2016-11-20 DIAGNOSIS — K760 Fatty (change of) liver, not elsewhere classified: Secondary | ICD-10-CM | POA: Diagnosis not present

## 2016-11-20 DIAGNOSIS — I700 Atherosclerosis of aorta: Secondary | ICD-10-CM | POA: Diagnosis not present

## 2016-11-20 NOTE — Progress Notes (Signed)
Dr. Frederico Hamman T. Noriel Guthrie, MD, [facility] [place], [address] Phone: [phone] Fax: [phone]  11/20/2016  Patient: Erik Edwards, MRN: [id number], DOB: 10/01/1945, 71 y.o.  Primary Physician:  Owens Loffler, MD   abd pain  Subjective:   Erik Edwards is a 71 y.o. very pleasant male patient who presents with the following:  ER thought ? Diverticulitis? Sunday and Monday - was really bad - in the lower stomach.  Pain begins in mainly in the lower quadrant.   Took 3 days of metronidazole.  Took 7 days of cipro.   He did have cholelithiasis on his CT of the abdomen and pelvis. The entirety of the patient's CT abdomen and pelvis was reviewed with him including his findings including fatty liver and aortic calcification.  Of clinical import, the patient does have bilateral inguinal hernias. In discussing his pain with me, his pain seems to be in primarily the lowest aspect of his abdomen.  Past Medical History, Surgical History, Social History, Family History, Problem List, Medications, and Allergies have been reviewed and updated if relevant.  Patient Active Problem List   Diagnosis Date Noted  . GERD (gastroesophageal reflux disease) 12/02/2013  . Obesity (BMI 30-39.9) 12/02/2013  . Erectile dysfunction 12/02/2013  . FATIGUE 03/08/2010    Past Medical History:  Diagnosis Date  . Diverticulosis   . GERD (gastroesophageal reflux disease) 12/02/2013  . History of chicken pox   . Inguinal hernia   . UTI (urinary tract infection)     No past surgical history on file.  Social History   Social History  . Marital status: Married    Spouse name: N/A  . Number of children: 2  . Years of education: N/A   Occupational History  . retired    Social History Main Topics  . Smoking status: Former Research scientist (life sciences)  . Smokeless tobacco: Never Used  . Alcohol use No  . Drug use: No  . Sexual activity: Not on file    Other Topics Concern  . Not on file   Social History Narrative  . No narrative on file    Family History  Problem Relation Age of Onset  . Lymphoma Father     No Known Allergies  Medication list reviewed and updated in full in [facility].   GEN: No acute illnesses, no fevers, chills. GI: No n/v/d, eating normally Pulm: No SOB Interactive and getting along well at home.  Otherwise, ROS is as per the HPI.  Objective:   BP 134/86 (BP Location: Right Arm, Patient Position: Sitting, Cuff Size: Normal)   Pulse 76   Temp 98.3 F (36.8 C) (Oral)   Ht 6\' 1"  (1.854 m) Comment: no shoes  Wt 256 lb 4 oz (116.2 kg)   SpO2 97%   BMI 33.81 kg/m   GEN: WDWN, NAD, Non-toxic, A & O x 3 HEENT: Atraumatic, Normocephalic. Neck supple. No masses, No LAD. Ears and Nose: No external deformity. CV: RRR, No M/G/R. No JVD. No thrill. No extra heart sounds. PULM: CTA B, no wheezes, crackles, rhonchi. No retractions. No resp. distress. No accessory muscle use. ABD: S, NT, ND, + BS, No rebound, No HSM  GU: Primary pain is at the inguinal ring region and in the inguinal canal bilaterally, and the patient can audible stay that basis where he had been having his discomfort and pain. EXTR: No c/c/e NEURO Normal gait.  PSYCH: Normally interactive.  Conversant. Not depressed or anxious appearing.  Calm demeanor.   Laboratory and Imaging Data: Ct Abdomen Pelvis W Contrast  Result Date: 11/06/2016 CLINICAL DATA:  71 year old male with left lower quadrant abdominal pain. EXAM: CT ABDOMEN AND PELVIS WITH CONTRAST TECHNIQUE: Multidetector CT imaging of the abdomen and pelvis was performed using the standard protocol following bolus administration of intravenous contrast. CONTRAST:  1 ISOVUE-300 IOPAMIDOL (ISOVUE-300) INJECTION 61% COMPARISON:  None. FINDINGS: Lower chest: The visualized lung bases are clear. No intra-abdominal free air or free fluid. Hepatobiliary: Probable mild fatty infiltration  of the liver. No intrahepatic biliary ductal dilatation. There is a calcified stone within the gallbladder. No pericholecystic fluid or evidence of acute cholecystitis by CT. Pancreas: Unremarkable. No pancreatic ductal dilatation or surrounding inflammatory changes. Spleen: Normal in size without focal abnormality. Adrenals/Urinary Tract: The adrenal glands appear unremarkable. Small exophytic bilateral renal hypodense lesions are poorly characterized due to small size but most likely represent cysts. There is no hydronephrosis on either side. The visualized ureters and urinary bladder appear unremarkable. Stomach/Bowel: There is sigmoid diverticulosis without active inflammatory changes. There is no evidence of bowel obstruction or active inflammation. Normal appendix. Vascular/Lymphatic: There is moderate aortoiliac atherosclerotic disease. No aneurysmal dilatation or evidence of dissection. The origins of the celiac axis, SMA, IMA as well as the origins of the renal arteries are patent. The SMV, splenic vein, and main portal vein are patent. No portal venous gas identified. There is no adenopathy. Reproductive: Mild enlargement of the prostate gland measuring up to 5.5 cm in transverse diameter. There is heterogeneity with central areas of lower attenuation. The seminal vesicles are grossly unremarkable. Other: Small bilateral fat containing inguinal hernias. There is slight protrusion of a loop of small bowel into the right inguinal region with stranding of the fat at the level of the right inguinal ligament concerning for a degree of strangulation. No fluid collection. Musculoskeletal: There is degenerative changes of the spine with disc space narrowing and endplate irregularity most prominent at L2-L3 and L5-S1. Lower lumbar facet arthropathy. No acute fracture. IMPRESSION: 1. Small bilateral fat containing inguinal hernia. There is mild stranding of the fat in the right hemipelvis at the right inguinal  ligament concerning for a degree of strangulation or incarceration of the herniated fat in the right inguinal canal. Clinical correlation is recommended. No fluid collection. 2. Sigmoid diverticulosis without active inflammatory changes. No evidence of bowel obstruction. Normal appendix. 3. Cholelithiasis. 4. Probable mild fatty liver. 5. Atherosclerotic calcification of the aorta. 6. Mild enlargement of the prostate gland. Electronically Signed   By: Anner Crete M.D.   On: 11/06/2016 01:30    Assessment and Plan:   Bi inguinal hernia, w/o obst or gangrene, not spcf as recur - Plan: Ambulatory referral to General Surgery  Aortic calcification (HCC)  Fatty liver  Symptomatic bilateral hernias, inguinal, discussed with the patient and his wife, and I think that elective referral for consultation with general surgery about definitive management is the best course of action.  Reviewed that he does have fatty liver, some calcification in his aorta, and also has cholelithiasis.  Follow-up: No Follow-up on file.  Orders Placed This Encounter  Procedures  . Ambulatory referral to General Surgery    Signed,  Frederico Hamman T. Aairah Negrette, MD   Allergies as of 11/20/2016   No Known Allergies     Medication List       Accurate as of 11/20/16  2:44 PM. Always use your most recent med list.  aspirin EC 81 MG tablet Take 81 mg by mouth every morning.   famotidine 20 MG tablet Commonly known as:  PEPCID TAKE 1 TABLET BY MOUTH TWO  TIMES DAILY   multivitamins ther. w/minerals Tabs tablet Take 1 tablet by mouth daily.   omeprazole 20 MG capsule Commonly known as:  PRILOSEC TAKE 1 CAPSULE BY MOUTH  DAILY

## 2016-11-20 NOTE — Patient Instructions (Signed)
Erik Edwards , Thank you for taking time to come for your Medicare Wellness Visit. I appreciate your ongoing commitment to your health goals. Please review the following plan we discussed and let me know if I can assist you in the future.   These are the goals we discussed: Goals    . Increase physical activity          Starting 11/20/2016, I will walk for 30 min daily as weather permits.        This is a list of the screening recommended for you and due dates:  Health Maintenance  Topic Date Due  . Colon Cancer Screening  06/11/2020  . Tetanus Vaccine  07/01/2026  . Flu Shot  Completed  .  Hepatitis C: One time screening is recommended by Center for Disease Control  (CDC) for  adults born from [birth year] through 1964.   Completed  . Pneumonia vaccines  Completed   Preventive Care for Adults  A healthy lifestyle and preventive care can promote health and wellness. Preventive health guidelines for adults include the following key practices.  . A routine yearly physical is a good way to check with your health care provider about your health and preventive screening. It is a chance to share any concerns and updates on your health and to receive a thorough exam.  . Visit your dentist for a routine exam and preventive care every 6 months. Brush your teeth twice a day and floss once a day. Good oral hygiene prevents tooth decay and gum disease.  . The frequency of eye exams is based on your age, health, family medical history, use  of contact lenses, and other factors. Follow your health care provider's ecommendations for frequency of eye exams.  . Eat a healthy diet. Foods like vegetables, fruits, whole grains, low-fat dairy products, and lean protein foods contain the nutrients you need without too many calories. Decrease your intake of foods high in solid fats, added sugars, and salt. Eat the right amount of calories for you. Get information about a proper diet from your health care provider,  if necessary.  . Regular physical exercise is one of the most important things you can do for your health. Most adults should get at least 150 minutes of moderate-intensity exercise (any activity that increases your heart rate and causes you to sweat) each week. In addition, most adults need muscle-strengthening exercises on 2 or more days a week.  Silver Sneakers may be a benefit available to you. To determine eligibility, you may visit the website: www.silversneakers.com or contact program at (586) 145-7376 Mon-Fri between 8AM-8PM.   . Maintain a healthy weight. The body mass index (BMI) is a screening tool to identify possible weight problems. It provides an estimate of body fat based on height and weight. Your health care provider can find your BMI and can help you achieve or maintain a healthy weight.   For adults 20 years and older: ? A BMI below 18.5 is considered underweight. ? A BMI of 18.5 to 24.9 is normal. ? A BMI of 25 to 29.9 is considered overweight. ? A BMI of 30 and above is considered obese.   . Maintain normal blood lipids and cholesterol levels by exercising and minimizing your intake of saturated fat. Eat a balanced diet with plenty of fruit and vegetables. Blood tests for lipids and cholesterol should begin at age 61 and be repeated every 5 years. If your lipid or cholesterol levels are high, you are over  50, or you are at high risk for heart disease, you may need your cholesterol levels checked more frequently. Ongoing high lipid and cholesterol levels should be treated with medicines if diet and exercise are not working.  . If you smoke, find out from your health care provider how to quit. If you do not use tobacco, please do not start.  . If you choose to drink alcohol, please do not consume more than 2 drinks per day. One drink is considered to be 12 ounces (355 mL) of beer, 5 ounces (148 mL) of wine, or 1.5 ounces (44 mL) of liquor.  . If you are 21-22 years old, ask  your health care provider if you should take aspirin to prevent strokes.  . Use sunscreen. Apply sunscreen liberally and repeatedly throughout the day. You should seek shade when your shadow is shorter than you. Protect yourself by wearing long sleeves, pants, a wide-brimmed hat, and sunglasses year round, whenever you are outdoors.  . Once a month, do a whole body skin exam, using a mirror to look at the skin on your back. Tell your health care provider of new moles, moles that have irregular borders, moles that are larger than a pencil eraser, or moles that have changed in shape or color.

## 2016-11-20 NOTE — Addendum Note (Signed)
Addended by: Ellamae Sia on: 11/20/2016 02:57 PM   Modules accepted: Orders

## 2016-11-20 NOTE — Progress Notes (Signed)
Subjective:   Erik Edwards is a 71 y.o. male who presents for Medicare Annual/Subsequent preventive examination.  Review of Systems:  N/A Cardiac Risk Factors include: advanced age (>76men, >81 women);male gender;obesity (BMI >30kg/m2)     Objective:    Vitals: BP 134/86 (BP Location: Right Arm, Patient Position: Sitting, Cuff Size: Normal)   Pulse 76   Temp 98.3 F (36.8 C) (Oral)   Ht 6\' 1"  (1.854 m) Comment: no shoes  Wt 256 lb 4 oz (116.2 kg)   SpO2 97%   BMI 33.81 kg/m   Body mass index is 33.81 kg/m.  Tobacco History  Smoking Status  . Former Smoker  Smokeless Tobacco  . Never Used     Counseling given: No   Past Medical History:  Diagnosis Date  . Diverticulosis   . GERD (gastroesophageal reflux disease) 12/02/2013  . History of chicken pox   . Inguinal hernia   . UTI (urinary tract infection)    History reviewed. No pertinent surgical history. Family History  Problem Relation Age of Onset  . Lymphoma Father    History  Sexual Activity  . Sexual activity: Not on file    Outpatient Encounter Prescriptions as of 11/20/2016  Medication Sig  . aspirin EC 81 MG tablet Take 81 mg by mouth every morning.  . famotidine (PEPCID) 20 MG tablet TAKE 1 TABLET BY MOUTH TWO  TIMES DAILY  . Multiple Vitamins-Minerals (MULTIVITAMINS THER. W/MINERALS) TABS Take 1 tablet by mouth daily.  Marland Kitchen omeprazole (PRILOSEC) 20 MG capsule TAKE 1 CAPSULE BY MOUTH  DAILY  . [DISCONTINUED] ciprofloxacin (CIPRO) 500 MG tablet Take 1 tablet (500 mg total) by mouth 2 (two) times daily. One po bid x 7 days  . [DISCONTINUED] metroNIDAZOLE (FLAGYL) 500 MG tablet Take 1 tablet (500 mg total) by mouth 2 (two) times daily. One po bid x 7 days   No facility-administered encounter medications on file as of 11/20/2016.     Activities of Daily Living In your present state of health, do you have any difficulty performing the following activities: 11/20/2016  Hearing? N  Vision? N  Difficulty  concentrating or making decisions? N  Walking or climbing stairs? N  Dressing or bathing? N  Doing errands, shopping? N  Preparing Food and eating ? N  Using the Toilet? N  In the past six months, have you accidently leaked urine? N  Do you have problems with loss of bowel control? N  Managing your Medications? N  Managing your Finances? N  Housekeeping or managing your Housekeeping? N  Some recent data might be hidden    Patient Care Team: Owens Loffler, MD as PCP - General   Assessment:     Hearing Screening   125Hz  250Hz  500Hz  1000Hz  2000Hz  3000Hz  4000Hz  6000Hz  8000Hz   Right ear:   40 40 40  0    Left ear:   40 40 40  0      Visual Acuity Screening   Right eye Left eye Both eyes  Without correction:     With correction: 20/30 20/30 20/30     Exercise Activities and Dietary recommendations Current Exercise Habits: Home exercise routine, Type of exercise: walking, Time (Minutes): 30, Frequency (Times/Week): 4, Weekly Exercise (Minutes/Week): 120, Intensity: Mild, Exercise limited by: None identified  Goals    . Increase physical activity          Starting 11/20/2016, I will walk for 30 min daily as weather permits.  Fall Risk Fall Risk  11/20/2016 03/27/2015 12/01/2013  Falls in the past year? Yes No No  Number falls in past yr: 1 - -  Injury with Fall? Yes - -   Depression Screen PHQ 2/9 Scores 11/20/2016 11/20/2016 03/27/2015 12/01/2013  PHQ - 2 Score 0 0 0 0    Cognitive Function MMSE - Mini Mental State Exam 11/20/2016 11/20/2016  Orientation to time 5 5  Orientation to Place 5 5  Registration 3 3  Attention/ Calculation 0 0  Recall 3 3  Language- name 2 objects 0 0  Language- repeat 1 1  Language- follow 3 step command 3 3  Language- read & follow direction 0 0  Write a sentence 0 0  Copy design 0 0  Total score 20 20     PLEASE NOTE: A Mini-Cog screen was completed. Maximum score is 20. A value of 0 denotes this part of Folstein MMSE was not completed  or the patient failed this part of the Mini-Cog screening.   Mini-Cog Screening Orientation to Time - Max 5 pts Orientation to Place - Max 5 pts Registration - Max 3 pts Recall - Max 3 pts Language Repeat - Max 1 pts Language Follow 3 Step Command - Max 3 pts     Immunization History  Administered Date(s) Administered  . Influenza, Seasonal, Injecte, Preservative Fre 07/27/2012  . Influenza,inj,Quad PF,36+ Mos 06/17/2013, 06/14/2014, 06/27/2015, 07/08/2016  . Pneumococcal Conjugate-13 12/01/2013  . Pneumococcal Polysaccharide-23 07/27/2012  . Tdap 07/01/2016  . Zoster 07/27/2012   Screening Tests Health Maintenance  Topic Date Due  . COLONOSCOPY  06/11/2020  . TETANUS/TDAP  07/01/2026  . INFLUENZA VACCINE  Completed  . Hepatitis C Screening  Completed  . PNA vac Low Risk Adult  Completed      Plan:     I have personally reviewed and addressed the Medicare Annual Wellness questionnaire and have noted the following in the patient's chart:  A. Medical and social history B. Use of alcohol, tobacco or illicit drugs  C. Current medications and supplements D. Functional ability and status E.  Nutritional status F.  Physical activity G. Advance directives H. List of other physicians I.  Hospitalizations, surgeries, and ER visits in previous 12 months J.  [place]obb to include hearing, vision, cognitive, depression L. Referrals and appointments - none  In addition, I have reviewed and discussed with patient certain preventive protocols, quality metrics, and best practice recommendations. A written personalized care plan for preventive services as well as general preventive health recommendations were provided to patient.  See attached scanned questionnaire for additional information.   Signed,   Lindell Noe, MHA, BS, LPN Health Coach

## 2016-11-20 NOTE — Progress Notes (Signed)
PCP notes:   Health maintenance:  Hep C screening - will be completed with future labs  Abnormal screenings:   Hearing - failed Fall risk - hx of fall with injury  Patient concerns:   Pt is having stomach concerns.  Nurse concerns:  None  Next PCP appt:   11/20/16 @ 1100

## 2016-11-20 NOTE — Progress Notes (Signed)
Pre visit review using our clinic review tool, if applicable. No additional management support is needed unless otherwise documented below in the visit note. 

## 2016-11-20 NOTE — Progress Notes (Signed)
I reviewed health advisor's note, was available for consultation, and agree with documentation and plan.   Signed,  Juda Lajeunesse T. Haylen Bellotti, MD  

## 2016-11-20 NOTE — Patient Instructions (Signed)

## 2016-11-21 ENCOUNTER — Ambulatory Visit: Payer: Self-pay | Admitting: Family Medicine

## 2016-11-21 LAB — HEPATITIS C ANTIBODY: HCV Ab: NEGATIVE

## 2016-12-03 DIAGNOSIS — H524 Presbyopia: Secondary | ICD-10-CM | POA: Diagnosis not present

## 2016-12-18 ENCOUNTER — Ambulatory Visit: Payer: Self-pay | Admitting: Surgery

## 2016-12-18 DIAGNOSIS — Z01818 Encounter for other preprocedural examination: Secondary | ICD-10-CM | POA: Diagnosis not present

## 2016-12-18 DIAGNOSIS — K4020 Bilateral inguinal hernia, without obstruction or gangrene, not specified as recurrent: Secondary | ICD-10-CM | POA: Diagnosis not present

## 2016-12-25 ENCOUNTER — Encounter (HOSPITAL_BASED_OUTPATIENT_CLINIC_OR_DEPARTMENT_OTHER): Payer: Self-pay | Admitting: *Deleted

## 2016-12-25 NOTE — Progress Notes (Signed)
To [facility] at 0930- Hg on arrival ,Ekg with chart.instructed Npo after Mn-will take prilosec,pepcid with water in am.Hibiclens shower evening prior and am of surgery

## 2017-01-02 ENCOUNTER — Ambulatory Visit (HOSPITAL_BASED_OUTPATIENT_CLINIC_OR_DEPARTMENT_OTHER)
Admission: RE | Admit: 2017-01-02 | Discharge: 2017-01-02 | Disposition: A | Discharge status: Home / Self Care | Payer: Medicare Other | Source: Ambulatory Visit | Attending: Surgery | Admitting: Surgery

## 2017-01-02 ENCOUNTER — Ambulatory Visit (HOSPITAL_BASED_OUTPATIENT_CLINIC_OR_DEPARTMENT_OTHER): Payer: Medicare Other | Admitting: Anesthesiology

## 2017-01-02 ENCOUNTER — Encounter (HOSPITAL_BASED_OUTPATIENT_CLINIC_OR_DEPARTMENT_OTHER)
Admission: RE | Disposition: A | Discharge status: Home / Self Care | Payer: Self-pay | Source: Ambulatory Visit | Attending: Surgery

## 2017-01-02 ENCOUNTER — Encounter (HOSPITAL_BASED_OUTPATIENT_CLINIC_OR_DEPARTMENT_OTHER): Payer: Self-pay | Admitting: *Deleted

## 2017-01-02 DIAGNOSIS — Z79899 Other long term (current) drug therapy: Secondary | ICD-10-CM | POA: Insufficient documentation

## 2017-01-02 DIAGNOSIS — K4020 Bilateral inguinal hernia, without obstruction or gangrene, not specified as recurrent: Secondary | ICD-10-CM | POA: Diagnosis present

## 2017-01-02 DIAGNOSIS — Z7982 Long term (current) use of aspirin: Secondary | ICD-10-CM | POA: Insufficient documentation

## 2017-01-02 DIAGNOSIS — Z6831 Body mass index (BMI) 31.0-31.9, adult: Secondary | ICD-10-CM | POA: Diagnosis not present

## 2017-01-02 DIAGNOSIS — Z87891 Personal history of nicotine dependence: Secondary | ICD-10-CM | POA: Diagnosis not present

## 2017-01-02 DIAGNOSIS — K219 Gastro-esophageal reflux disease without esophagitis: Secondary | ICD-10-CM | POA: Insufficient documentation

## 2017-01-02 HISTORY — DX: Syncope and collapse: R55

## 2017-01-02 HISTORY — PX: INGUINAL HERNIA REPAIR: SHX194

## 2017-01-02 HISTORY — PX: INSERTION OF MESH: SHX5868

## 2017-01-02 SURGERY — REPAIR, HERNIA, INGUINAL, BILATERAL, LAPAROSCOPIC
Anesthesia: General | Site: Groin | Laterality: Bilateral

## 2017-01-02 MED ORDER — MIDAZOLAM HCL 2 MG/2ML IJ SOLN
0.5000 mg | Freq: Once | INTRAMUSCULAR | Status: DC | PRN
  Filled 2017-01-02: qty 2

## 2017-01-02 MED ORDER — PROPOFOL 10 MG/ML IV BOLUS
INTRAVENOUS | Status: AC
  Filled 2017-01-02: qty 20

## 2017-01-02 MED ORDER — DIAZEPAM 5 MG PO TABS: 5.0000 mg | ORAL_TABLET | Freq: Four times a day (QID) | ORAL | 2 refills | Status: DC | PRN

## 2017-01-02 MED ORDER — HYDROMORPHONE HCL 1 MG/ML IJ SOLN
0.2500 mg | INTRAMUSCULAR | Status: DC | PRN
  Filled 2017-01-02: qty 0.5

## 2017-01-02 MED ORDER — OXYCODONE HCL 5 MG PO TABS
5.0000 mg | ORAL_TABLET | ORAL | Status: DC | PRN
  Filled 2017-01-02: qty 2

## 2017-01-02 MED ORDER — BUPIVACAINE-EPINEPHRINE 0.25% -1:200000 IJ SOLN
INTRAMUSCULAR | Status: DC | PRN
  Administered 2017-01-02: 60 mL

## 2017-01-02 MED ORDER — MAGIC MOUTHWASH
15.0000 mL | Freq: Four times a day (QID) | ORAL | Status: DC | PRN
  Filled 2017-01-02: qty 15

## 2017-01-02 MED ORDER — ROCURONIUM BROMIDE 50 MG/5ML IV SOSY
PREFILLED_SYRINGE | INTRAVENOUS | Status: AC
  Filled 2017-01-02: qty 5

## 2017-01-02 MED ORDER — FENTANYL CITRATE (PF) 100 MCG/2ML IJ SOLN
INTRAMUSCULAR | Status: AC
  Filled 2017-01-02: qty 2

## 2017-01-02 MED ORDER — DEXAMETHASONE SODIUM PHOSPHATE 10 MG/ML IJ SOLN
INTRAMUSCULAR | Status: AC
  Filled 2017-01-02: qty 1

## 2017-01-02 MED ORDER — SODIUM CHLORIDE 0.9% FLUSH
3.0000 mL | Freq: Two times a day (BID) | INTRAVENOUS | Status: DC
  Filled 2017-01-02: qty 3

## 2017-01-02 MED ORDER — ROCURONIUM BROMIDE 50 MG/5ML IV SOSY
PREFILLED_SYRINGE | INTRAVENOUS | Status: DC | PRN
  Administered 2017-01-02: 20 mg via INTRAVENOUS
  Administered 2017-01-02: 50 mg via INTRAVENOUS
  Administered 2017-01-02: 10 mg via INTRAVENOUS

## 2017-01-02 MED ORDER — LIDOCAINE 2% (20 MG/ML) 5 ML SYRINGE
INTRAMUSCULAR | Status: DC | PRN
  Administered 2017-01-02: 100 mg via INTRAVENOUS

## 2017-01-02 MED ORDER — PROMETHAZINE HCL 25 MG/ML IJ SOLN
6.2500 mg | INTRAMUSCULAR | Status: DC | PRN
  Filled 2017-01-02: qty 1

## 2017-01-02 MED ORDER — ONDANSETRON HCL 4 MG/2ML IJ SOLN
INTRAMUSCULAR | Status: DC | PRN
  Administered 2017-01-02: 4 mg via INTRAVENOUS

## 2017-01-02 MED ORDER — SODIUM CHLORIDE 0.9% FLUSH
3.0000 mL | INTRAVENOUS | Status: DC | PRN
  Filled 2017-01-02: qty 3

## 2017-01-02 MED ORDER — ACETAMINOPHEN 650 MG RE SUPP
650.0000 mg | RECTAL | Status: DC | PRN
  Filled 2017-01-02: qty 1

## 2017-01-02 MED ORDER — ACETAMINOPHEN 325 MG PO TABS
650.0000 mg | ORAL_TABLET | ORAL | Status: DC | PRN
  Filled 2017-01-02: qty 2

## 2017-01-02 MED ORDER — NAPROXEN 500 MG PO TABS: 500.0000 mg | ORAL_TABLET | Freq: Two times a day (BID) | ORAL | 1 refills | Status: DC | PRN

## 2017-01-02 MED ORDER — MEPERIDINE HCL 25 MG/ML IJ SOLN
6.2500 mg | INTRAMUSCULAR | Status: DC | PRN
  Filled 2017-01-02: qty 1

## 2017-01-02 MED ORDER — FENTANYL CITRATE (PF) 100 MCG/2ML IJ SOLN
25.0000 ug | INTRAMUSCULAR | Status: DC | PRN
Start: 2017-01-02 — End: 2017-01-02
  Filled 2017-01-02: qty 1

## 2017-01-02 MED ORDER — PROPOFOL 10 MG/ML IV BOLUS
INTRAVENOUS | Status: DC | PRN
  Administered 2017-01-02: 200 mg via INTRAVENOUS

## 2017-01-02 MED ORDER — CEFAZOLIN SODIUM-DEXTROSE 2-4 GM/100ML-% IV SOLN
INTRAVENOUS | Status: AC
  Filled 2017-01-02: qty 100

## 2017-01-02 MED ORDER — ONDANSETRON HCL 4 MG/2ML IJ SOLN
INTRAMUSCULAR | Status: AC
Start: 2017-01-02 — End: 2017-01-02
  Filled 2017-01-02: qty 2

## 2017-01-02 MED ORDER — FENTANYL CITRATE (PF) 100 MCG/2ML IJ SOLN
INTRAMUSCULAR | Status: DC | PRN
  Administered 2017-01-02: 100 ug via INTRAVENOUS
  Administered 2017-01-02: 50 ug via INTRAVENOUS

## 2017-01-02 MED ORDER — SODIUM CHLORIDE 0.9 % IR SOLN
Status: DC | PRN
  Administered 2017-01-02: 3000 mL

## 2017-01-02 MED ORDER — SODIUM CHLORIDE 0.9 % IV SOLN
250.0000 mL | INTRAVENOUS | Status: DC | PRN
  Filled 2017-01-02: qty 250

## 2017-01-02 MED ORDER — ACETAMINOPHEN 500 MG PO TABS
ORAL_TABLET | ORAL | Status: AC
  Filled 2017-01-02: qty 2

## 2017-01-02 MED ORDER — LIDOCAINE 2% (20 MG/ML) 5 ML SYRINGE
INTRAMUSCULAR | Status: AC
  Filled 2017-01-02: qty 5

## 2017-01-02 MED ORDER — DEXAMETHASONE SODIUM PHOSPHATE 10 MG/ML IJ SOLN
INTRAMUSCULAR | Status: DC | PRN
  Administered 2017-01-02: 10 mg via INTRAVENOUS

## 2017-01-02 MED ORDER — CHLORHEXIDINE GLUCONATE CLOTH 2 % EX PADS
6.0000 | MEDICATED_PAD | Freq: Once | CUTANEOUS | Status: DC
  Filled 2017-01-02: qty 6

## 2017-01-02 MED ORDER — SUGAMMADEX SODIUM 200 MG/2ML IV SOLN
INTRAVENOUS | Status: AC
  Filled 2017-01-02: qty 2

## 2017-01-02 MED ORDER — ACETAMINOPHEN 500 MG PO TABS
1000.0000 mg | ORAL_TABLET | ORAL | Status: AC
  Administered 2017-01-02: 1000 mg via ORAL
  Filled 2017-01-02: qty 2

## 2017-01-02 MED ORDER — CEFAZOLIN SODIUM-DEXTROSE 2-4 GM/100ML-% IV SOLN
2.0000 g | INTRAVENOUS | Status: AC
  Administered 2017-01-02: 2 g via INTRAVENOUS
  Filled 2017-01-02: qty 100

## 2017-01-02 MED ORDER — GABAPENTIN 300 MG PO CAPS
ORAL_CAPSULE | ORAL | Status: AC
  Filled 2017-01-02: qty 1

## 2017-01-02 MED ORDER — SUGAMMADEX SODIUM 200 MG/2ML IV SOLN
INTRAVENOUS | Status: DC | PRN
  Administered 2017-01-02: 200 mg via INTRAVENOUS

## 2017-01-02 MED ORDER — BUPIVACAINE HCL (PF) 0.25 % IJ SOLN
INTRAMUSCULAR | Status: AC
  Filled 2017-01-02: qty 30

## 2017-01-02 MED ORDER — DEXTROSE 5 % IV SOLN
1000.0000 mg | Freq: Four times a day (QID) | INTRAVENOUS | Status: DC | PRN
  Filled 2017-01-02: qty 10

## 2017-01-02 MED ORDER — TRAMADOL HCL 50 MG PO TABS: 50.0000 mg | ORAL_TABLET | Freq: Four times a day (QID) | ORAL | 0 refills | Status: DC | PRN

## 2017-01-02 MED ORDER — GABAPENTIN 300 MG PO CAPS
300.0000 mg | ORAL_CAPSULE | ORAL | Status: AC
  Administered 2017-01-02: 300 mg via ORAL
  Filled 2017-01-02: qty 1

## 2017-01-02 MED ORDER — DIAZEPAM 5 MG PO TABS
5.0000 mg | ORAL_TABLET | Freq: Four times a day (QID) | ORAL | Status: DC | PRN
  Filled 2017-01-02: qty 1

## 2017-01-02 MED ORDER — LACTATED RINGERS IV SOLN
INTRAVENOUS | Status: DC
  Administered 2017-01-02 (×2): via INTRAVENOUS
  Filled 2017-01-02: qty 1000

## 2017-01-02 MED ORDER — LACTATED RINGERS IV BOLUS (SEPSIS)
1000.0000 mL | Freq: Three times a day (TID) | INTRAVENOUS | Status: DC | PRN
  Filled 2017-01-02: qty 1000

## 2017-01-02 SURGICAL SUPPLY — 50 items
BLADE SURG 11 STRL SS (BLADE) ×3 IMPLANT
CABLE HIGH FREQUENCY MONO STRZ (ELECTRODE) ×3 IMPLANT
CANISTER SUCT 3000ML PPV (MISCELLANEOUS) IMPLANT
CHLORAPREP W/TINT 26ML (MISCELLANEOUS) ×3 IMPLANT
COVER BACK TABLE 60X90IN (DRAPES) ×3 IMPLANT
COVER MAYO STAND STRL (DRAPES) ×3 IMPLANT
DECANTER SPIKE VIAL GLASS SM (MISCELLANEOUS) ×1 IMPLANT
DEVICE SECURE STRAP 25 ABSORB (INSTRUMENTS) IMPLANT
DRAPE LAPAROSCOPIC ABDOMINAL (DRAPES) ×3 IMPLANT
DRAPE UTILITY XL STRL (DRAPES) ×3 IMPLANT
DRAPE WARM FLUID 44X44 (DRAPE) ×3 IMPLANT
DRSG TEGADERM 2-3/8X2-3/4 SM (GAUZE/BANDAGES/DRESSINGS) ×7 IMPLANT
DRSG TEGADERM 4X4.75 (GAUZE/BANDAGES/DRESSINGS) ×3 IMPLANT
ELECT REM PT RETURN 9FT ADLT (ELECTROSURGICAL) ×3
ELECTRODE REM PT RTRN 9FT ADLT (ELECTROSURGICAL) ×1 IMPLANT
GLOVE BIO SURGEON STRL SZ8 (GLOVE) ×2 IMPLANT
GLOVE BIOGEL PI IND STRL 7.0 (GLOVE) IMPLANT
GLOVE BIOGEL PI IND STRL 7.5 (GLOVE) IMPLANT
GLOVE BIOGEL PI IND STRL 8 (GLOVE) IMPLANT
GLOVE BIOGEL PI INDICATOR 7.0 (GLOVE) ×2
GLOVE BIOGEL PI INDICATOR 7.5 (GLOVE) ×4
GLOVE BIOGEL PI INDICATOR 8 (GLOVE) ×2
GLOVE ECLIPSE 8.0 STRL XLNG CF (GLOVE) ×3 IMPLANT
GLOVE INDICATOR 8.0 STRL GRN (GLOVE) ×3 IMPLANT
GOWN STRL REUS W/TWL XL LVL3 (GOWN DISPOSABLE) ×7 IMPLANT
KIT RM TURNOVER CYSTO AR (KITS) ×3 IMPLANT
MANIFOLD NEPTUNE II (INSTRUMENTS) IMPLANT
MESH ULTRAPRO 6X6 15CM15CM (Mesh General) ×6 IMPLANT
NEEDLE HYPO 22GX1.5 SAFETY (NEEDLE) ×3 IMPLANT
NS IRRIG 500ML POUR BTL (IV SOLUTION) ×3 IMPLANT
PACK BASIN DAY SURGERY FS (CUSTOM PROCEDURE TRAY) ×3 IMPLANT
PAD POSITIONING PINK XL (MISCELLANEOUS) ×3 IMPLANT
SCISSORS LAP 5X35 DISP (ENDOMECHANICALS) ×3 IMPLANT
SET IRRIG TUBING LAPAROSCOPIC (IRRIGATION / IRRIGATOR) ×2 IMPLANT
SLEEVE ADV FIXATION 5X100MM (TROCAR) ×3 IMPLANT
SPONGE GAUZE 2X2 8PLY STER LF (GAUZE/BANDAGES/DRESSINGS) ×2
SPONGE GAUZE 2X2 8PLY STRL LF (GAUZE/BANDAGES/DRESSINGS) ×4 IMPLANT
SUT MNCRL AB 4-0 PS2 18 (SUTURE) ×3 IMPLANT
SUT VIC AB 2-0 SH 27 (SUTURE) ×6
SUT VIC AB 2-0 SH 27X BRD (SUTURE) IMPLANT
SUT VIC AB 2-0 SH 27XBRD (SUTURE) IMPLANT
SUT VICRYL 0 UR6 27IN ABS (SUTURE) ×3 IMPLANT
SYR 20CC LL (SYRINGE) ×6 IMPLANT
TOWEL OR 17X24 6PK STRL BLUE (TOWEL DISPOSABLE) ×6 IMPLANT
TRAY DSU PREP LF (CUSTOM PROCEDURE TRAY) ×3 IMPLANT
TROCAR ADV FIXATION 5X100MM (TROCAR) ×3 IMPLANT
TROCAR BLADELESS OPT 5 100 (ENDOMECHANICALS) IMPLANT
TROCAR XCEL BLUNT TIP 100MML (ENDOMECHANICALS) ×3 IMPLANT
TUBING INSUF HEATED (TUBING) ×3 IMPLANT
WATER STERILE IRR 500ML POUR (IV SOLUTION) ×1 IMPLANT

## 2017-01-02 NOTE — Transfer of Care (Signed)
Immediate Anesthesia Transfer of Care Note  Patient: Erik Edwards  Procedure(s) Performed: Procedure(s): LAPAROSCOPIC EXPLORATION AND REPAIR OF BILATERAL INGUINAL HERNIA (Bilateral) INSERTION OF MESH (Bilateral)  Patient Location: PACU  Anesthesia Type:General  Level of Consciousness: sedated and responds to stimulation  Airway & Oxygen Therapy: Patient Spontanous Breathing and Patient connected to nasal cannula oxygen  Post-op Assessment: Report given to RN and Post -op Vital signs reviewed and stable  Post vital signs: Reviewed and stable  Last Vitals: 133/74, 74, 16, 97%, 97.7 Vitals:   01/02/17 0925  BP: 139/85  Pulse: 69  Resp: 18  Temp: 37.1 C    Last Pain:  Vitals:   01/02/17 0925  TempSrc: Oral      Patients Stated Pain Goal: 8 (06/28/14 9458)  Complications: No apparent anesthesia complications

## 2017-01-02 NOTE — Anesthesia Postprocedure Evaluation (Signed)
Anesthesia Post Note  Patient: Erik Edwards  Procedure(s) Performed: Procedure(s) (LRB): LAPAROSCOPIC EXPLORATION AND REPAIR OF BILATERAL INGUINAL HERNIA (Bilateral) INSERTION OF MESH (Bilateral)  Patient location during evaluation: PACU Anesthesia Type: General Level of consciousness: awake and alert Pain management: pain level controlled Vital Signs Assessment: post-procedure vital signs reviewed and stable Respiratory status: spontaneous breathing, nonlabored ventilation, respiratory function stable and patient connected to nasal cannula oxygen Cardiovascular status: blood pressure returned to baseline and stable Postop Assessment: no signs of nausea or vomiting Anesthetic complications: no       Last Vitals:  Vitals:   01/02/17 0925 01/02/17 1245  BP: 139/85 133/74  Pulse: 69 74  Resp: 18 17  Temp: 37.1 C 36.5 C    Last Pain:  Vitals:   01/02/17 0925  TempSrc: Oral                 Hargun Spurling S

## 2017-01-02 NOTE — Interval H&P Note (Signed)
History and Physical Interval Note:  01/02/2017 10:43 AM  Erik Edwards  has presented today for surgery, with the diagnosis of Hernias in both groins inguinal hernias  The various methods of treatment have been discussed with the patient and family. After consideration of risks, benefits and other options for treatment, the patient has consented to  Procedure(s): [place] (Bilateral) INSERTION OF MESH (Bilateral) as a surgical intervention .  The patient's history has been reviewed, patient examined, no change in status, stable for surgery.  I have reviewed the patient's chart and labs.  Questions were answered to the patient's satisfaction.     Samanyu Tinnell C.

## 2017-01-02 NOTE — Op Note (Signed)
01/02/2017  1:00 PM  PATIENT:  Erik Edwards  71 y.o. male  Patient Care Team: Owens Loffler, MD as PCP - Huston Foley, MD as Consulting Physician (General Surgery) Sherren Mocha, MD as Consulting Physician (Cardiology)  PRE-OPERATIVE DIAGNOSIS:  Hernias in both groins inguinal hernias  POST-OPERATIVE DIAGNOSIS:    Bilateral inguinal hernias (direct and indirect, pantaloon type)  PROCEDURE:   LAPAROSCOPIC REPAIR OF BILATERAL INGUINAL HERNIAS INSERTION OF MESH  SURGEON:  Adin Hector, MD  ASSISTANT: None  ANESTHESIA:     Regional ilioinguinal and genitofemoral and spermatic cord nerve blocks  General  EBL:  Total I/O In: 750 [I.V.:750] Out: 20 [Blood:20].  See anesthesia record  Delay start of Pharmacological VTE agent (>24hrs) due to surgical blood loss or risk of bleeding:  no  DRAINS: NONE  SPECIMEN:   NONE  DISPOSITION OF SPECIMEN:  N/A  COUNTS:  YES  PLAN OF CARE: Discharge to home after PACU  PATIENT DISPOSITION:  PACU - hemodynamically stable.  INDICATION: Pleasant overweight male with obvious right groin pain and hernia.  Evidence of left anal hernias well.  I recommended exploration and repair of hernias found.  The anatomy & physiology of the abdominal wall and pelvic floor was discussed.  The pathophysiology of hernias in the inguinal and pelvic region was discussed.  Natural history risks such as progressive enlargement, pain, incarceration & strangulation was discussed.   Contributors to complications such as smoking, obesity, diabetes, prior surgery, etc were discussed.    I feel the risks of no intervention will lead to serious problems that outweigh the operative risks; therefore, I recommended surgery to reduce and repair the hernia.  I explained laparoscopic techniques with possible need for an open approach.  I noted usual use of mesh to patch and/or buttress hernia repair  Risks such as bleeding, infection, abscess, need for  further treatment, heart attack, death, and other risks were discussed.  I noted a good likelihood this will help address the problem.   Goals of post-operative recovery were discussed as well.  Possibility that this will not correct all symptoms was explained.  I stressed the importance of low-impact activity, aggressive pain control, avoiding constipation, & not pushing through pain to minimize risk of post-operative chronic pain or injury. Possibility of reherniation was discussed.  We will work to minimize complications.     An educational handout further explaining the pathology & treatment options was given as well.  Questions were answered.  The patient expresses understanding & wishes to proceed with surgery.  OR FINDINGS: Patient had right greater than left pantaloon type inguinal hernias.  No femoral hernia.  No obturator hernia.  Right direct > left direct > right indirect > left indirect inguinal hernias.  DESCRIPTION:  The patient was identified & brought into the operating room. The patient was positioned supine with arms tucked. SCDs were active during the entire case. The patient underwent general anesthesia without any difficulty.  The abdomen was prepped and draped in a sterile fashion. The patient's bladder was emptied.  A Surgical Timeout confirmed our plan.  Patient had an irritated 4 mm acrochordon skin tag in the infraumbilical region.  I excised this off as it was pedunculated and assured hemostasis.  I made a transverse incision through the inferior umbilical fold.  I made a small transverse nick through the anterior rectus fascia contralateral to the inguinal hernia side and placed a 0-vicryl stitch through the fascia.  I placed a Hasson trocar  into the preperitoneal plane.  Entry was clean.  We induced carbon dioxide insufflation. Camera inspection revealed no injury.  I used a 60mm angled scope to bluntly free the peritoneum off the infraumbilical anterior abdominal wall.  I  created enough of a preperitoneal pocket to place 82mm ports into the right & left mid-abdomen into this preperitoneal cavity.  I focused attention on the RIGHT pelvis since that was the dominant hernia side.   I used blunt & focused sharp dissection to free the peritoneum off the flank and down to the pubic rim.  I freed the anteriolateral bladder wall off the anteriolateral pelvic wall, sparing midline attachments.   I located a swath of peritoneum going into a hernia fascial defect at the  direct space consistent with  a direct space inguinal hernia..  I gradually freed the peritoneal hernia sac off safely and reduced it into the preperitoneal space.  I freed the peritoneum off the spermatic vessels & vas deferens.  I reduced a small but definite indirect inguinal hernia as well.  I freed peritoneum off the retroperitoneum along the psoas muscle.  Spermatic cord lipoma was dissected away & removed.  I checked & assured hemostasis.     I turned attention on the opposite  LEFT pelvis.  I did dissection in a similar, mirror-image fashion. The patient had a direct space inguinal hernia..  Small but definite left indirect inguinal hernia as well.   Spermatic cord lipoma was dissected away & removed.    I checked & assured hemostasis.  As anticipated, surgical dissection was challenged by dense adhesions and poor planes resulting in the need for careful repair of the resulting peritoneal hernia sac defects.  Repair was done with  minimally invasive intracorporeal suturing using absorbable 2-0 Vicryl suture.  This allowed me to do a high ligation of the sac swells help tack down the moderate preperitoneal fat that had been herniated up in the direct space hernias  I chose 15x15 cm sheets of ultra-lightweight polypropylene mesh (Ultrapro), one for each side.  I cut a single sigmoid-shaped slit ~6cm from a corner of each mesh.  I placed the meshes into the preperitoneal space & laid them as overlapping diamonds such  that at the inferior points, a 6x6 cm corner flap rested in the true anterolateral pelvis, covering the obturator & femoral foramina.   I allowed the bladder to return to the pubis, this helping tuck the corners of the mesh in the anteriolateral pelvis.  The medial corners overlapped each other across midline cephalad to the pubic rim.   Given the numerous hernias of moderate size, I placed a third 15x15cm mesh in the center as a vertical diamond.  The lateral wings of the mesh overlap across the direct spaces and internal rings where the dominant hernias were.  This provided good coverage and reinforcement of the hernia repairs.  Because of the central mesh placement with good overlap, I did not place any tacks.   I held the hernia sacs cephalad & evacuated carbon dioxide.  I closed the fascia with absorbable suture.  I closed the skin using 4-0 monocryl stitch.  Sterile dressings were applied.   The patient was extubated & arrived in the PACU in stable condition..  I had discussed postoperative care with the patient in the holding area.  Instructions are written in the chart.  I discussed operative findings, updated the patient's status, discussed probable steps to recovery, and gave postoperative recommendations to the patient's family.  Recommendations were made.  Questions were answered.  They expressed understanding & appreciation.   Adin Hector, M.D., F.A.C.S. Gastrointestinal and Minimally Invasive Surgery [facility] [address], [place], [place] [address] [phone] Main / Paging  01/02/2017 1:00 PM

## 2017-01-02 NOTE — H&P (Signed)
Erik Edwards 12/18/2016 9:34 AM Location: [facility] Patient #: [id number] DOB: 12/01/1945 Married / Language: Cleophus Molt / Race: Refused to Report/Unreported Male  Patient Care Team: Owens Loffler, MD as PCP - Huston Foley, MD as Consulting Physician (General Surgery) Sherren Mocha, MD as Consulting Physician (Cardiology)    History of Present Illness ) The patient is a 71 year old male who presents with an inguinal hernia. Note for "Inguinal hernia": ` ` ` Patient sent for surgical consultation by primary care physician Dr. Donella Stade. Concern for symptomatic bilateral inguinal hernias  Pleasant gentleman. Comes today with his wife. He's had intermittent episodes of severe sharp lower abdominal pain. Has gone to the emergency room a few times with this. Pain so bad that he has passed out or nearly passed out. There was concerned perhaps there was a heart issue. EKGs have been underwhelming. Had a rather thorough cardiac workup in 2013 by Dr. Burt Knack including negative stress tests and CT cardiogram. Had negative CT of the head. Patient notes that is episodes of otherwise been triggered with lower pain. Has had a known inguinal hernia. Because of increasing symptoms, primary care physician or strongly recommended surgical evaluation.  Patient had a vasectomy but no other surgery. He normally can walk several miles without difficulty. Usually moves his bowels most days. Has some diverticulosis and a few small benign polyps on colonoscopy. Last one done 2 years ago. No problems with urination. Just once a night. No difficulty with straining. There is concern of a dirty UA last year and was placed antibiotics. He had pain in his left lower side. They did place him on oral Cipro/Flagyl for possible sigmoid diverticulitis even though no evidence of a by CAT scan. Patient now has worsening lower pain with even light or moderate lifting. Bothersome  to sit for long periods of time. Concerned. Interested in surgery if that's what he needs. No history of infections. No new events.  Ready for surgery   Past Surgical History (Janette Ranson, CMA; 12/18/2016 9:34 AM) No pertinent past surgical history   Diagnostic Studies History (Janette Ranson, CMA; 12/18/2016 9:34 AM) Colonoscopy  1-5 years ago  Allergies (Janette Ranson, CMA; 12/18/2016 9:35 AM) No Known Drug Allergies 12/18/2016 Allergies Reconciled   Medication History (Janette Ranson, CMA; 12/18/2016 9:36 AM) Famotidine (20MG  Tablet, Oral) Active. Omeprazole (20MG  Capsule DR, Oral) Active. Aspirin (81MG  Tablet DR, Oral) Active. Multivitamin Adults (Oral) Active.  Social History (Janette Ranson, CMA; 12/18/2016 9:34 AM) Caffeine use  Carbonated beverages, Coffee, Tea. No alcohol use  No drug use  Tobacco use  Former smoker.  Family History (Janette Ranson, CMA; 12/18/2016 9:34 AM) Hypertension  Mother.  Other Problems (Janette Ranson, CMA; 12/18/2016 9:34 AM) Cholelithiasis  Diverticulosis  Gastroesophageal Reflux Disease     Review of Systems (Janette Ranson CMA; 12/18/2016 9:34 AM) General Not Present- Appetite Loss, Chills, Fatigue, Fever, Night Sweats, Weight Gain and Weight Loss. Skin Not Present- Change in Wart/Mole, Dryness, Hives, Jaundice, New Lesions, Non-Healing Wounds, Rash and Ulcer. HEENT Present- Wears glasses/contact lenses. Not Present- Earache, Hearing Loss, Hoarseness, Nose Bleed, Oral Ulcers, Ringing in the Ears, Seasonal Allergies, Sinus Pain, Sore Throat, Visual Disturbances and Yellow Eyes. Respiratory Present- Snoring. Not Present- Bloody sputum, Chronic Cough, Difficulty Breathing and Wheezing. Breast Not Present- Breast Mass, Breast Pain, Nipple Discharge and Skin Changes. Cardiovascular Not Present- Chest Pain, Difficulty Breathing Lying Down, Leg Cramps, Palpitations, Rapid Heart Rate, Shortness of Breath and Swelling of  Extremities. Gastrointestinal Present- Abdominal  Pain and Hemorrhoids. Not Present- Bloating, Bloody Stool, Change in Bowel Habits, Chronic diarrhea, Constipation, Difficulty Swallowing, Excessive gas, Gets full quickly at meals, Indigestion, Nausea, Rectal Pain and Vomiting. Male Genitourinary Not Present- Blood in Urine, Change in Urinary Stream, Frequency, Impotence, Nocturia, Painful Urination, Urgency and Urine Leakage. Musculoskeletal Not Present- Back Pain, Joint Pain, Joint Stiffness, Muscle Pain, Muscle Weakness and Swelling of Extremities. Neurological Not Present- Decreased Memory, Fainting, Headaches, Numbness, Seizures, Tingling, Tremor, Trouble walking and Weakness. Psychiatric Not Present- Anxiety, Bipolar, Change in Sleep Pattern, Depression, Fearful and Frequent crying. Endocrine Not Present- Cold Intolerance, Excessive Hunger, Hair Changes, Heat Intolerance, Hot flashes and New Diabetes. Hematology Not Present- Blood Thinners, Easy Bruising, Excessive bleeding, Gland problems, HIV and Persistent Infections.  Vitals (Janette Ranson CMA; 12/18/2016 9:36 AM) 12/18/2016 9:36 AM Weight: 257.8 lb Height: 74.5in Body Surface Area: 2.43 m Body Mass Index: 32.66 kg/m  Temp.: 99.54F  Pulse: 66 (Regular)  BP: 134/74 (Sitting, Left Arm, Standard)    BP 139/85 (BP Location: Right Arm, Patient Position: Sitting)   Pulse 69   Temp 98.7 F (37.1 C) (Oral)   Resp 18   Ht 6\' 2"  (1.88 m)   Wt 112.9 kg (249 lb)   SpO2 99%   BMI 31.97 kg/m     Physical Exam Adin Hector MD; 12/18/2016 10:17 AM) General Mental Status-Alert. General Appearance-Not in acute distress, Not Sickly. Orientation-Oriented X3. Hydration-Well hydrated. Voice-Normal.  Integumentary Global Assessment Upon inspection and palpation of skin surfaces of the - Axillae: non-tender, no inflammation or ulceration, no drainage. and Distribution of scalp and body hair is normal. General  Characteristics Temperature - normal warmth is noted.  Head and Neck Head-normocephalic, atraumatic with no lesions or palpable masses. Face Global Assessment - atraumatic, no absence of expression. Neck Global Assessment - no abnormal movements, no bruit auscultated on the right, no bruit auscultated on the left, no decreased range of motion, non-tender. Trachea-midline. Thyroid Gland Characteristics - non-tender.  Eye Eyeball - Left-Extraocular movements intact, No Nystagmus. Eyeball - Right-Extraocular movements intact, No Nystagmus. Cornea - Left-No Hazy. Cornea - Right-No Hazy. Sclera/Conjunctiva - Left-No scleral icterus, No Discharge. Sclera/Conjunctiva - Right-No scleral icterus, No Discharge. Pupil - Left-Direct reaction to light normal. Pupil - Right-Direct reaction to light normal.  ENMT Ears Pinna - Left - no drainage observed, no generalized tenderness observed. Right - no drainage observed, no generalized tenderness observed. Nose and Sinuses External Inspection of the Nose - no destructive lesion observed. Inspection of the nares - Left - quiet respiration. Right - quiet respiration. Mouth and Throat Lips - Upper Lip - no fissures observed, no pallor noted. Lower Lip - no fissures observed, no pallor noted. Nasopharynx - no discharge present. Oral Cavity/Oropharynx - Tongue - no dryness observed. Oral Mucosa - no cyanosis observed. Hypopharynx - no evidence of airway distress observed.  Chest and Lung Exam Inspection Movements - Normal and Symmetrical. Accessory muscles - No use of accessory muscles in breathing. Palpation Palpation of the chest reveals - Non-tender. Auscultation Breath sounds - Normal and Clear.  Cardiovascular Auscultation Rhythm - Regular. Murmurs & Other Heart Sounds - Auscultation of the heart reveals - No Murmurs and No Systolic Clicks.  Abdomen Inspection Inspection of the abdomen reveals - No Visible peristalsis  and No Abnormal pulsations. Umbilicus - No Bleeding, No Urine drainage. Palpation/Percussion Palpation and Percussion of the abdomen reveal - Soft, Non Tender, No Rebound tenderness, No Rigidity (guarding) and No Cutaneous hyperesthesia. Note: Abdomen obese but soft.  Nontender, nondistended. No guarding. Diastasis. No umbilical nor other hernias   Male Genitourinary Sexual Maturity Tanner 5 - Adult hair pattern and Adult penile size and shape. Note: Obvious right inguinal hernia of moderate size. Sensitive but reducible. Left inguinal hernia present on Valsalva. Seems more lateral/indirect. Otherwise normal external male genitalia. Large mons pubis   Peripheral Vascular Upper Extremity Inspection - Left - No Cyanotic nailbeds, Not Ischemic. Right - No Cyanotic nailbeds, Not Ischemic.  Neurologic Neurologic evaluation reveals -normal attention span and ability to concentrate, able to name objects and repeat phrases. Appropriate fund of knowledge , normal sensation and normal coordination. Mental Status Affect - not angry, not paranoid. Cranial Nerves-Normal Bilaterally. Gait-Normal.  Neuropsychiatric Mental status exam performed with findings of-able to articulate well with normal speech/language, rate, volume and coherence, thought content normal with ability to perform basic computations and apply abstract reasoning and no evidence of hallucinations, delusions, obsessions or homicidal/suicidal ideation.  Musculoskeletal Global Assessment Spine, Ribs and Pelvis - no instability, subluxation or laxity. Right Upper Extremity - no instability, subluxation or laxity.  Lymphatic Head & Neck  General Head & Neck Lymphatics: Bilateral - Description - No Localized lymphadenopathy. Axillary  General Axillary Region: Bilateral - Description - No Localized lymphadenopathy. Femoral & Inguinal  Generalized Femoral & Inguinal Lymphatics: Left - Description - No Localized  lymphadenopathy. Right - Description - No Localized lymphadenopathy.    Assessment & Plan  BILATERAL INGUINAL HERNIA WITHOUT OBSTRUCTION OR GANGRENE, RECURRENCE NOT SPECIFIED (K40.20) Impression: Right greater than left inguinal hernias with episodes of sharp pain related to prolonged positioning/activity. Seems more consistent with inguinal pain. The rest of the differential diagnosis unlikely.  He's had a few lightheaded/near syncopal events with negative cardiac workup a few years back. Always triggered with sharp pain. Unless his primary care physician has concerns (he does not seem to based on recent notes), I would not do more aggressive cardiac reworkup since he had it just done a few years ago. CT head negative. No recent events.  I think he would benefit from surgical repair of his right greater than left inguinal hernias. He is very interested in proceeding. His wife agrees. Laparoscopic approach.    PREOP - ING HERNIA - ENCOUNTER FOR PREOPERATIVE EXAMINATION FOR GENERAL SURGICAL PROCEDURE (Z01.818) Current Plans You are being scheduled for surgery- Our schedulers will call you.  You should hear from our office's scheduling department within 5 working days about the location, date, and time of surgery. We try to make accommodations for patient's preferences in scheduling surgery, but sometimes the OR schedule or the surgeon's schedule prevents [place] from making those accommodations.  If you have not heard from our office [phone]) in 5 working days, call the office and ask for your surgeon's nurse.  If you have other questions about your diagnosis, plan, or surgery, call the office and ask for your surgeon's nurse.  Written instructions provided The anatomy & physiology of the abdominal wall and pelvic floor was discussed. The pathophysiology of hernias in the inguinal and pelvic region was discussed. Natural history risks such as progressive enlargement, pain,  incarceration, and strangulation was discussed. Contributors to complications such as smoking, obesity, diabetes, prior surgery, etc were discussed.  I feel the risks of no intervention will lead to serious problems that outweigh the operative risks; therefore, I recommended surgery to reduce and repair the hernia. I explained laparoscopic techniques with possible need for an open approach. I noted usual use of mesh to patch and/or buttress  hernia repair  Risks such as bleeding, infection, abscess, need for further treatment, heart attack, death, and other risks were discussed. I noted a good likelihood this will help address the problem. Goals of post-operative recovery were discussed as well. Possibility that this will not correct all symptoms was explained. I stressed the importance of low-impact activity, aggressive pain control, avoiding constipation, & not pushing through pain to minimize risk of post-operative chronic pain or injury. Possibility of reherniation was discussed. We will work to minimize complications.  An educational handout further explaining the pathology & treatment options was given as well. Questions were answered. The patient expresses understanding & wishes to proceed with surgery.  Pt Education - Pamphlet Given - Laparoscopic Hernia Repair: discussed with patient and provided information. Pt Education - [facility] Pain Control (Renarda Mullinix) Pt Education - [facility] Hernia Post-Op HCI (Terrion Gencarelli): discussed with patient and provided information.  I have re-reviewed the the patient's records, history, medications, and allergies.  I have re-examined the patient.  I again discussed intraoperative plans and goals of post-operative recovery.  The patient agrees to proceed.  Erik Edwards  04/17/1946 [id number]  Patient Care Team: Owens Loffler, MD as PCP - Huston Foley, MD as Consulting Physician (General Surgery) Sherren Mocha, MD as Consulting Physician  (Cardiology)  Patient Active Problem List   Diagnosis Date Noted  . Aortic calcification ([place]) 11/20/2016  . Bi inguinal hernia, w/o obst or gangrene, not spcf as recur 11/20/2016  . Fatty liver 11/20/2016  . GERD (gastroesophageal reflux disease) 12/02/2013  . Obesity (BMI 30-39.9) 12/02/2013  . Erectile dysfunction 12/02/2013  . FATIGUE 03/08/2010    Past Medical History:  Diagnosis Date  . Diverticulosis   . GERD (gastroesophageal reflux disease) 12/02/2013  . History of chicken pox   . Inguinal hernia 01/03/2016   bilateral -repair  . Syncope 07/01/2016  . UTI (urinary tract infection)     History reviewed. No pertinent surgical history.  Social History   Social History  . Marital status: Married    Spouse name: N/A  . Number of children: 2  . Years of education: N/A   Occupational History  . retired    Social History Main Topics  . Smoking status: Former Research scientist (life sciences)  . Smokeless tobacco: Never Used  . Alcohol use No  . Drug use: No  . Sexual activity: Not on file   Other Topics Concern  . Not on file   Social History Narrative  . No narrative on file    Family History  Problem Relation Age of Onset  . Lymphoma Father     Current Facility-Administered Medications  Medication Dose Route Frequency Provider Last Rate Last Dose  . ceFAZolin (ANCEF) IVPB 2g/100 mL premix  2 g Intravenous On Call to OR Michael Boston, MD      . Chlorhexidine Gluconate Cloth 2 % PADS 6 each  6 each Topical Once Michael Boston, MD       And  . Chlorhexidine Gluconate Cloth 2 % PADS 6 each  6 each Topical Once Michael Boston, MD      . lactated ringers infusion   Intravenous Continuous Annye Asa, MD 50 mL/hr at 01/02/17 1000       No Known Allergies  BP 139/85 (BP Location: Right Arm, Patient Position: Sitting)   Pulse 69   Temp 98.7 F (37.1 C) (Oral)   Resp 18   Ht 6\' 2"  (1.88 m)   Wt 112.9 kg (249 lb)   SpO2 99%  BMI 31.97 kg/m   Labs: No results found for  this or any previous visit (from the past 48 hour(s)).  Imaging / Studies: No results found.   Adin Hector, M.D., F.A.C.S. Gastrointestinal and Minimally Invasive Surgery [facility] [address] [place], [place] [address] [phone] Main / Paging  01/02/2017 10:25 AM

## 2017-01-02 NOTE — Discharge Instructions (Signed)
[facility]ERNIA REPAIR: POST OP INSTRUCTIONS ° °###################################################################### ° °EAT °Gradually transition to a high fiber diet with a fiber supplement over the next few weeks after discharge.  Start with a pureed / full liquid diet (see below) ° °WALK °Walk an hour a day.  Control your pain to do that.   ° °CONTROL PAIN °Control pain so that you can walk, sleep, tolerate sneezing/coughing, go up/down stairs. ° °HAVE A BOWEL MOVEMENT DAILY °Keep your bowels regular to avoid problems.  OK to try a laxative to override constipation.  OK to use an antidairrheal to slow down diarrhea.  Call if not better after 2 tries ° °CALL IF YOU HAVE PROBLEMS/CONCERNS °Call if you are still struggling despite following these instructions. °Call if you have concerns not answered by these instructions ° °###################################################################### ° ° ° °1. DIET: Follow a light bland diet the first 24 hours after arrival home, such as soup, liquids, crackers, etc.  Be sure to include lots of fluids daily.  Avoid fast food or heavy meals as your are more likely to get nauseated.  Eat a low fat the next few days after surgery. °2. Take your usually prescribed home medications unless otherwise directed. °3. PAIN CONTROL: °a. Pain is best controlled by a usual combination of three different methods TOGETHER: °i. Ice/Heat °ii. Over the counter pain medication °iii. Prescription pain medication °b. Most patients will experience some swelling and bruising around the hernia(s) such as the bellybutton, groins, or old incisions.  Ice packs or heating pads (30-60 minutes up to 6 times a day) will help. Use ice for the first few days to help decrease swelling and bruising, then switch to heat to help relax tight/sore spots and speed recovery.  Some people prefer to use ice alone, heat alone, alternating between ice & heat.  Experiment to what works for you.  Swelling and bruising can take  several weeks to resolve.   °c. It is helpful to take an over-the-counter pain medication regularly for the first few weeks.  Choose one of the following that works best for you: °i. Naproxen (Aleve, etc)  Two 220mg tabs twice a day °ii. Ibuprofen (Advil, etc) Three 200mg tabs four times a day (every meal & bedtime) °iii. Acetaminophen (Tylenol, etc) 325-650mg four times a day (every meal & bedtime) °d. A  prescription for pain medication should be given to you upon discharge.  Take your pain medication as prescribed.  °i. If you are having problems/concerns with the prescription medicine (does not control pain, nausea, vomiting, rash, itching, etc), please call us [phone] to see if we need to switch you to a different pain medicine that will work better for you and/or control your side effect better. °ii. If you need a refill on your pain medication, please contact your pharmacy.  They will contact our office to request authorization. Prescriptions will not be filled after 5 pm or on week-ends. °4. Avoid getting constipated.  Between the surgery and the pain medications, it is common to experience some constipation.  Increasing fluid intake and taking a fiber supplement (such as Metamucil, Citrucel, FiberCon, MiraLax, etc) 1-2 times a day regularly will usually help prevent this problem from occurring.  A mild laxative (prune juice, Milk of Magnesia, MiraLax, etc) should be taken according to package directions if there are no bowel movements after 48 hours.   °5. Wash / shower every day.  You may shower over the dressings as they are waterproof.   °6. Remove   your waterproof bandages 5 days after surgery.  You may leave the incision open to air.  You may replace a dressing/Band-Aid to cover the incision for comfort if you wish.  Continue to shower over incision(s) after the dressing is off.    7. ACTIVITIES as tolerated:   a. You may resume regular (light) daily activities beginning the next day--such  as daily self-care, walking, climbing stairs--gradually increasing activities as tolerated.  If you can walk 30 minutes without difficulty, it is safe to try more intense activity such as jogging, treadmill, bicycling, low-impact aerobics, swimming, etc. b. Save the most intensive and strenuous activity for last such as sit-ups, heavy lifting, contact sports, etc  Refrain from any heavy lifting or straining until you are off narcotics for pain control.   c. DO NOT PUSH THROUGH PAIN.  Let pain be your guide: If it hurts to do something, don't do it.  Pain is your body warning you to avoid that activity for another week until the pain goes down. d. You may drive when you are no longer taking prescription pain medication, you can comfortably wear a seatbelt, and you can safely maneuver your car and apply brakes. e. Dennis Bast may have sexual intercourse when it is comfortable.  8. FOLLOW UP in our office a. Please call [facility] at [phone] to set up an appointment to see your surgeon in the office for a follow-up appointment approximately 2-3 weeks after your surgery. b. Make sure that you call for this appointment the day you arrive home to insure a convenient appointment time. 9.  IF YOU HAVE DISABILITY OR FAMILY LEAVE FORMS, BRING THEM TO THE OFFICE FOR PROCESSING.  DO NOT GIVE THEM TO YOUR DOCTOR.  WHEN TO CALL us [phone]: 1. Poor pain control 2. Reactions / problems with new medications (rash/itching, nausea, etc)  3. Fever over 101.5 F (38.5 C) 4. Inability to urinate 5. Nausea and/or vomiting 6. Worsening swelling or bruising 7. Continued bleeding from incision. 8. Increased pain, redness, or drainage from the incision   The clinic staff is available to answer your questions during regular business hours (8:30am-5pm).  Please dont hesitate to call and ask to speak to one of our nurses for clinical concerns.   If you have a medical emergency, go to the nearest emergency room or call  911.  A surgeon from [facility] is always on call at the hospitals in [facility], [place], [place], [place], [place]  [address] ?  [address] [address], [place], [place]   [address] MAIN: [phone] ? TOLL FREE: [phone] ? FAX: [phone] [email or web address]   Inguinal Hernia, Adult An inguinal hernia is when fat or the intestines push through the area where the leg meets the lower abdomen (groin) and create a rounded lump (bulge). This condition develops over time. There are three types of inguinal hernias. These types include:  Hernias that can be pushed back into the belly (are reducible).  Hernias that are not reducible (are incarcerated).  Hernias that are not reducible and lose their blood supply (are strangulated). This type of hernia requires emergency surgery. What are the causes? This condition is caused by having a weak spot in the muscles or tissue. This weakness lets the hernia poke through. This condition can be triggered by:  Suddenly straining the muscles of the lower abdomen.  Lifting heavy objects.  Straining to have a bowel movement. Difficult bowel movements (constipation) can  lead to this.  Coughing. What increases the risk? This condition is more likely to develop in:  Men.  Pregnant women.  People who:  Are overweight.  Work in jobs that require long periods of standing or heavy lifting.  Have had an inguinal hernia before.  Smoke or have lung disease. These factors can lead to long-lasting (chronic) coughing. What are the signs or symptoms? Symptoms can depend on the size of the hernia. Often, a small inguinal hernia has no symptoms. Symptoms of a larger hernia include:  A lump in the groin. This is easier to see when the person is standing. It might not be visible when he or she is lying down.  Pain or burning in the groin. This occurs especially when lifting, straining, or  coughing.  A dull ache or a feeling of pressure in the groin.  A lump in the scrotum in men. Symptoms of a strangulated inguinal hernia can include:  A bulge in the groin that is very painful and tender to the touch.  A bulge that turns red or purple.  Fever, nausea, and vomiting.  The inability to have a bowel movement or to pass gas. How is this diagnosed? This condition is diagnosed with a medical history and physical exam. Your health care provider may feel your groin area and ask you to cough. How is this treated? Treatment for this condition varies depending on the size of your hernia and whether you have symptoms. If you do not have symptoms, your health care provider may have you watch your hernia carefully and come in for follow-up visits. If your hernia is larger or if you have symptoms, your treatment will include surgery. Follow these instructions at home: Lifestyle   Drink enough fluid to keep your urine clear or pale yellow.  Eat a diet that includes a lot of fiber. Eat plenty of fruits, vegetables, and whole grains. Talk with your health care provider if you have questions.  Avoid lifting heavy objects.  Avoid standing for long periods of time.  Do not use tobacco products, including cigarettes, chewing tobacco, or e-cigarettes. If you need help quitting, ask your health care provider.  Maintain a healthy weight. General instructions   Do not try to force the hernia back in.  Watch your hernia for any changes in color or size. Let your health care provider know if any changes occur.  Take over-the-counter and prescription medicines only as told by your health care provider.  Keep all follow-up visits as told by your health care provider. This is important. Contact a health care provider if:  You have a fever.  You have new symptoms.  Your symptoms get worse. Get help right away if:  You have pain in the groin that suddenly gets worse.  A bulge in the  groin gets bigger suddenly and does not go down.  You are a man and you have a sudden pain in the scrotum, or the size of your scrotum suddenly changes.  A bulge in the groin area becomes red or purple and is painful to the touch.  You have nausea or vomiting that does not go away.  You feel your heart beating a lot more quickly than normal.  You cannot have a bowel movement or pass gas. This information is not intended to replace advice given to you by your health care provider. Make sure you discuss any questions you have with your health care provider. Document Released: 12/29/2008 Document Revised: 01/18/2016  Document Reviewed: 06/22/2014 Elsevier Interactive Patient Education  2017 [facility] Instructions  Activity: Get plenty of rest for the remainder of the day. A responsible individual must stay with you for 24 hours following the procedure.  For the next 24 hours, DO NOT: -Drive a car -Paediatric nurse -Drink alcoholic beverages -Take any medication unless instructed by your physician -Make any legal decisions or sign important papers.  Meals: Start with liquid foods such as gelatin or soup. Progress to regular foods as tolerated. Avoid greasy, spicy, heavy foods. If nausea and/or vomiting occur, drink only clear liquids until the nausea and/or vomiting subsides. Call your physician if vomiting continues.  Special Instructions/Symptoms: Your throat may feel dry or sore from the anesthesia or the breathing tube placed in your throat during surgery. If this causes discomfort, gargle with warm salt water. The discomfort should disappear within 24 hours.  If you had a scopolamine patch placed behind your ear for the management of post- operative nausea and/or vomiting:  1. The medication in the patch is effective for 72 hours, after which it should be removed.  Wrap patch in a tissue and discard in the trash. Wash hands thoroughly with soap and  water. 2. You may remove the patch earlier than 72 hours if you experience unpleasant side effects which may include dry mouth, dizziness or visual disturbances. 3. Avoid touching the patch. Wash your hands with soap and water after contact with the patch.

## 2017-01-02 NOTE — Anesthesia Preprocedure Evaluation (Addendum)
Anesthesia Evaluation  Patient identified by MRN, date of birth, ID band Patient awake    Reviewed: Allergy & Precautions, NPO status , Patient's Chart, lab work & pertinent test results  History of Anesthesia Complications Negative for: history of anesthetic complications  Airway Mallampati: III  TM Distance: >3 FB Neck ROM: Full    Dental  (+) Dental Advisory Given   Pulmonary neg pulmonary ROS, former smoker,    breath sounds clear to auscultation       Cardiovascular negative cardio ROS   Rhythm:Regular Rate:Normal  '13 cardiac CT: very minimal ASCADz   Neuro/Psych negative neurological ROS     GI/Hepatic Neg liver ROS, GERD  Medicated and Controlled,  Endo/Other  Morbid obesity  Renal/GU negative Renal ROS     Musculoskeletal   Abdominal (+) + obese,   Peds  Hematology negative hematology ROS (+)   Anesthesia Other Findings   Reproductive/Obstetrics                            Anesthesia Physical Anesthesia Plan  ASA: II  Anesthesia Plan: General   Post-op Pain Management:    Induction: Intravenous  Airway Management Planned: Oral ETT  Additional Equipment:   Intra-op Plan:   Post-operative Plan: Extubation in OR  Informed Consent: I have reviewed the patients History and Physical, chart, labs and discussed the procedure including the risks, benefits and alternatives for the proposed anesthesia with the patient or authorized representative who has indicated his/her understanding and acceptance.   Dental advisory given  Plan Discussed with: CRNA and Surgeon  Anesthesia Plan Comments: (Plan routine monitors, GETA)        Anesthesia Quick Evaluation

## 2017-01-02 NOTE — Anesthesia Procedure Notes (Signed)
Procedure Name: Intubation Date/Time: 01/02/2017 11:01 AM Performed by: Bethena Roys T Pre-anesthesia Checklist: Patient identified, Emergency Drugs available, Suction available and Patient being monitored Patient Re-evaluated:Patient Re-evaluated prior to inductionOxygen Delivery Method: Circle system utilized Preoxygenation: Pre-oxygenation with 100% oxygen Intubation Type: IV induction Ventilation: Mask ventilation without difficulty and Oral airway inserted - appropriate to patient size Laryngoscope Size: Mac and 4 Grade View: Grade II Tube type: Oral Tube size: 8.0 mm Number of attempts: 1 Airway Equipment and Method: Stylet and Oral airway Placement Confirmation: ETT inserted through vocal cords under direct vision,  positive ETCO2 and breath sounds checked- equal and bilateral Secured at: 22 cm Tube secured with: Tape Dental Injury: Teeth and Oropharynx as per pre-operative assessment

## 2017-01-03 LAB — POCT HEMOGLOBIN-HEMACUE: HEMOGLOBIN: 16.8 g/dL (ref 13.0–17.0)

## 2017-01-06 ENCOUNTER — Encounter (HOSPITAL_BASED_OUTPATIENT_CLINIC_OR_DEPARTMENT_OTHER): Payer: Self-pay | Admitting: Surgery

## 2017-01-27 NOTE — Anesthesia Postprocedure Evaluation (Signed)
Anesthesia Post Note  Patient: Erik Edwards  Procedure(s) Performed: Procedure(s) (LRB): LAPAROSCOPIC EXPLORATION AND REPAIR OF BILATERAL INGUINAL HERNIA (Bilateral) INSERTION OF MESH (Bilateral)     Anesthesia Post Evaluation  Last Vitals:  Vitals:   01/02/17 1345 01/02/17 1500  BP: 131/71 (!) 161/82  Pulse: 64 78  Resp: 12 14  Temp:  36.7 C    Last Pain:  Vitals:   01/02/17 1500  TempSrc:   PainSc: 0-No pain                 Marylon Verno S

## 2017-01-27 NOTE — Addendum Note (Signed)
Addendum  created 01/27/17 1431 by Norena Bratton, MD   Sign clinical note    

## 2017-05-19 ENCOUNTER — Other Ambulatory Visit: Payer: Self-pay | Admitting: Family Medicine

## 2017-06-20 ENCOUNTER — Ambulatory Visit (INDEPENDENT_AMBULATORY_CARE_PROVIDER_SITE_OTHER): Payer: Medicare Other

## 2017-06-20 DIAGNOSIS — Z23 Encounter for immunization: Secondary | ICD-10-CM | POA: Diagnosis not present

## 2017-06-21 IMAGING — CT CT HEAD W/O CM
4 series · 15 of 47 positions shown, 17 images · non-contrast
Comparison: None.

CLINICAL DATA: Syncope with loss of consciousness. Trauma to left
side of head.

EXAM:
CT HEAD WITHOUT CONTRAST
TECHNIQUE: Contiguous axial images were obtained from the base of the skull
through the vertex without intravenous contrast.

[Series 2: head without ax · axial · non-contrast · 0.37mm/px · z∈[+212,+331]mm · 6 of 35 slices shown, 8 images]
[im 5/35  brain]
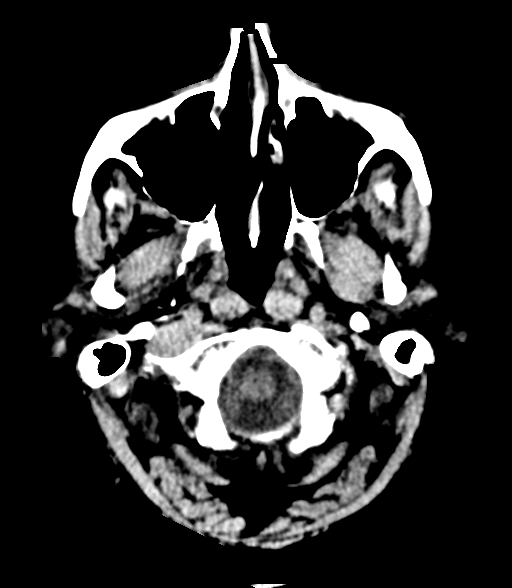
[im 5/35  bone]
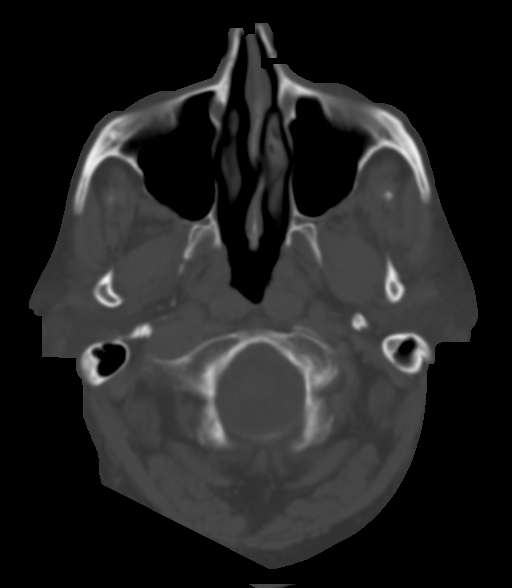
[im 10/35  brain]
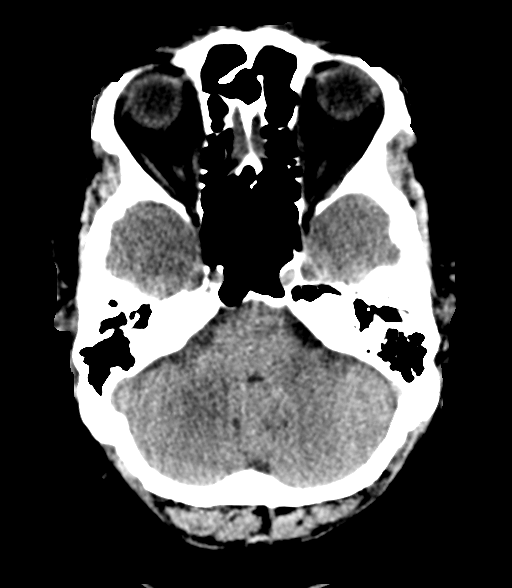
[im 15/35  brain]
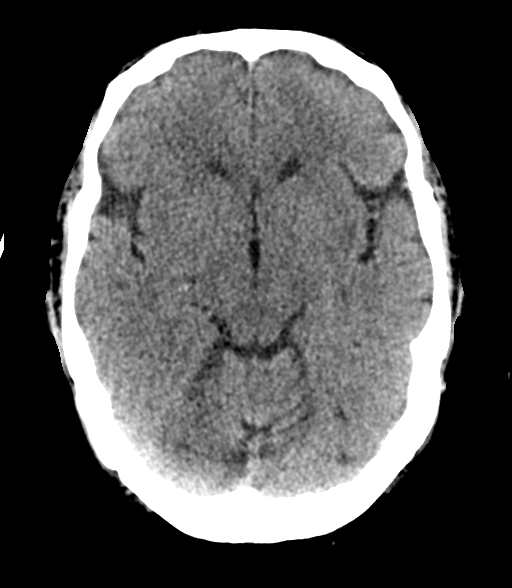
[im 20/35  brain]
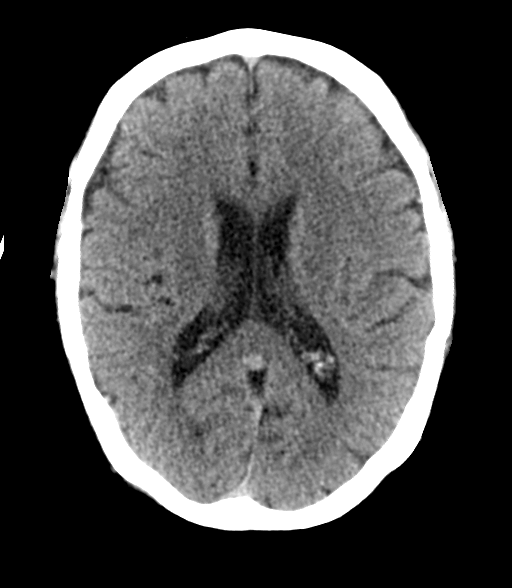
[im 25/35  brain]
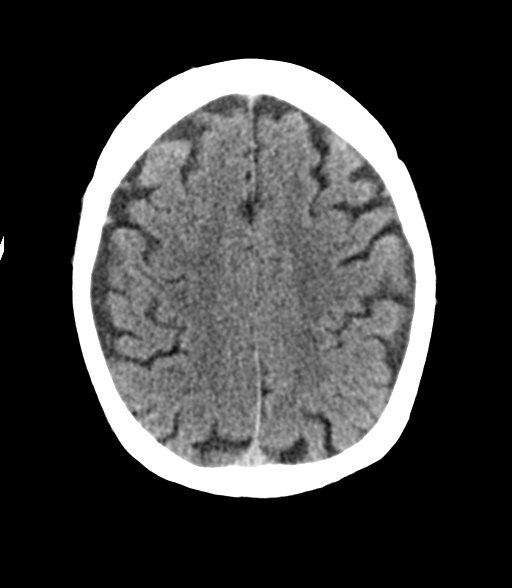
[im 25/35  bone]
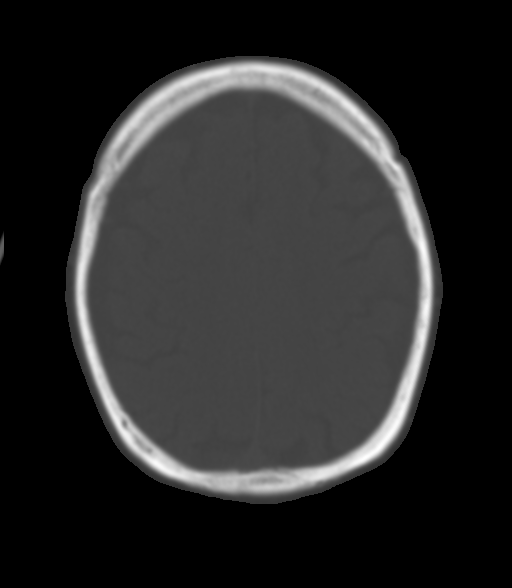
[im 30/35  brain]
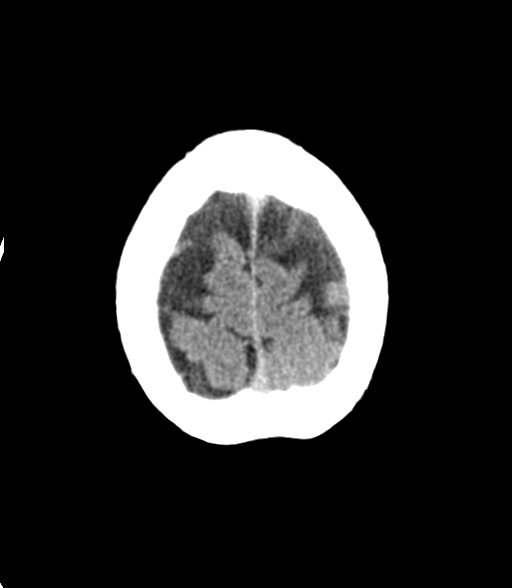

[Series 3: head bone · axial · 0.43mm/px · z∈[+186,+230]mm · 3 of 92 slices shown]
[im 9/92  bone]
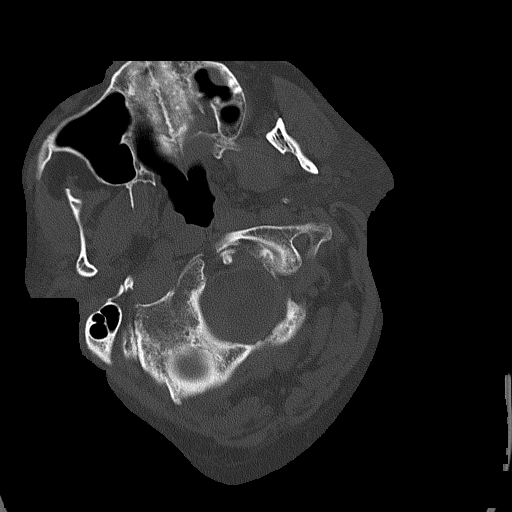
[im 18/92  bone]
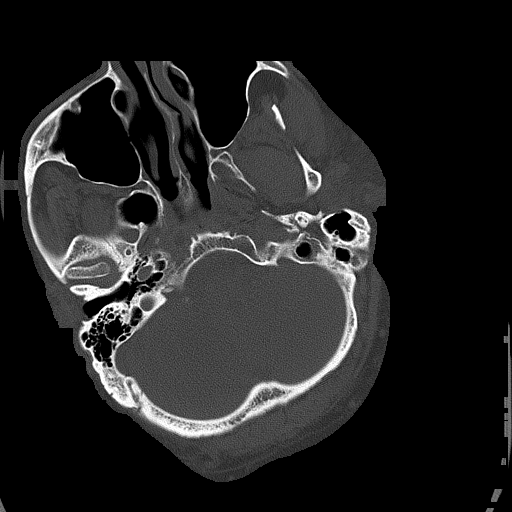
[im 31/92  bone]
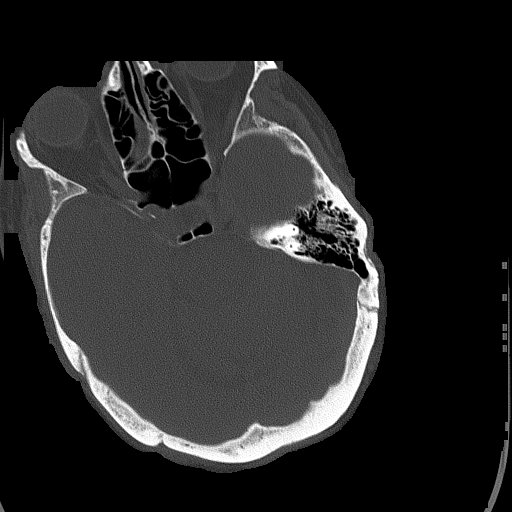

[Series 4: head without cor · coronal · non-contrast · 0.36mm/px · 3 of 68 slices shown]
[im 23/68  brain]
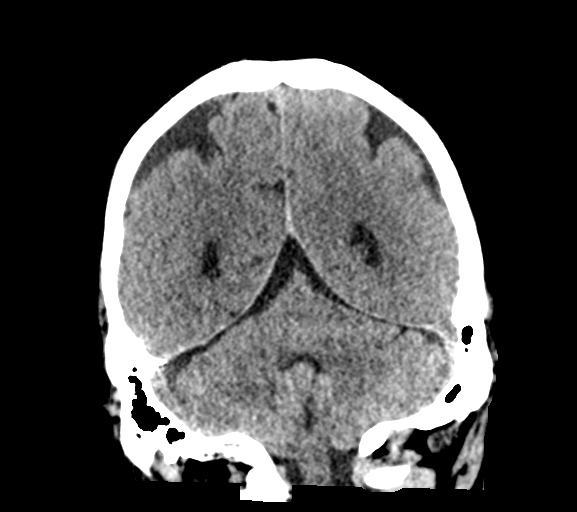
[im 30/68  brain]
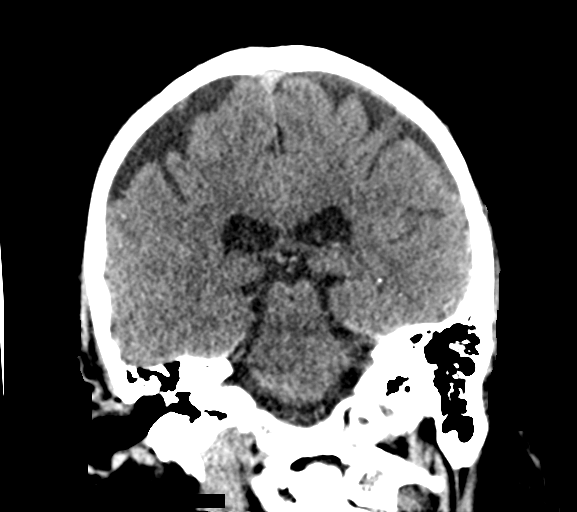
[im 38/68  brain]
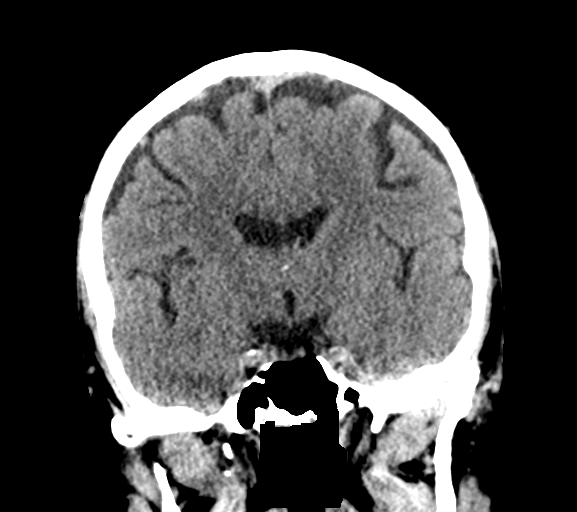

[Series 5: head without sag · sagittal · non-contrast · 0.34mm/px · 3 of 52 slices shown]
[im 18/52  brain]
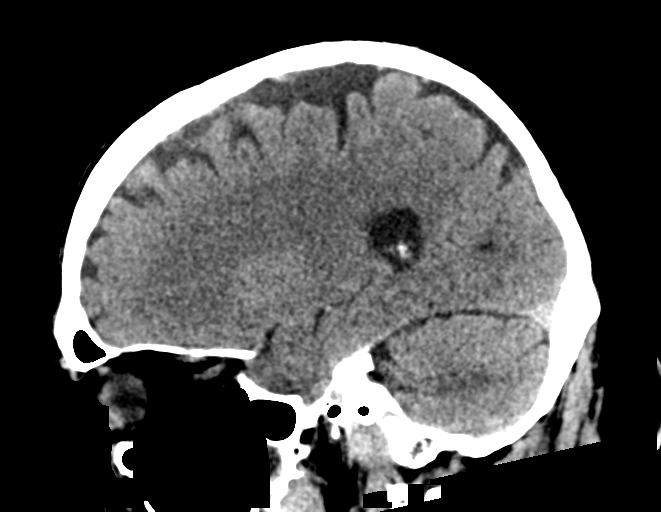
[im 26/52  brain]
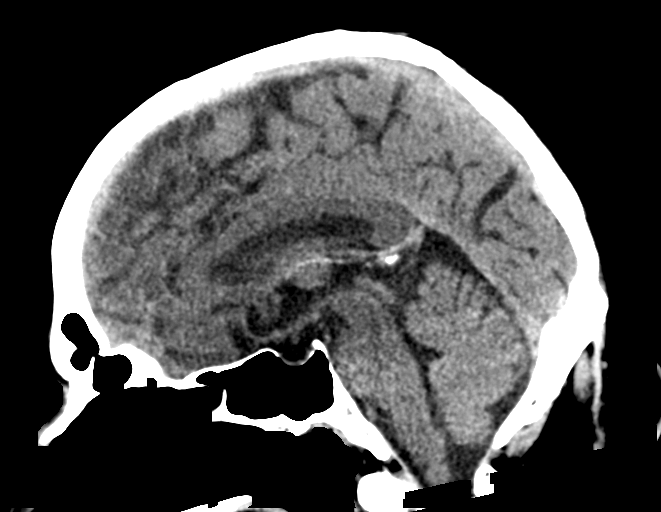
[im 35/52  brain]
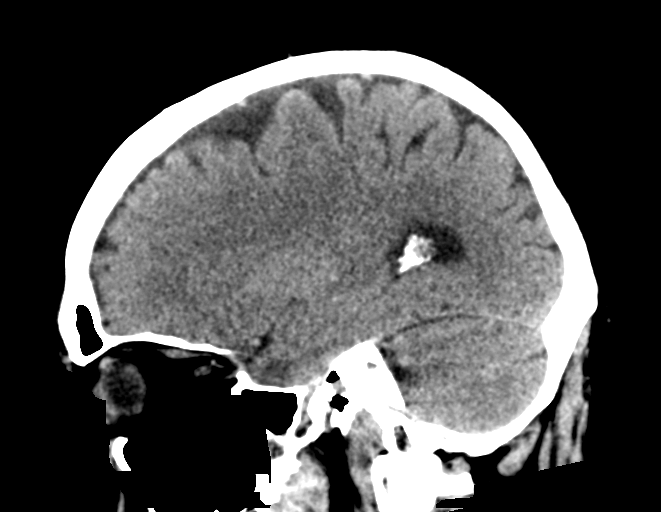

[15 of 47 positions shown; findings below may reference images not displayed]

FINDINGS: BRAIN: Mild superficial atrophy with sulcal prominence. No
intraparenchymal hemorrhage, mass effect nor midline shift. Patchy
supratentorial white matter hypodensities within normal range for
patient's age, though non-specific are most compatible with chronic
small vessel ischemic disease. No acute large vascular territory
infarcts. No abnormal extra-axial fluid collections. Basal cisterns
are patent.

VASCULAR: Moderate calcific atherosclerosis of the carotid siphons.

SKULL: No skull fracture. No significant scalp soft tissue swelling.

SINUSES/ORBITS: The mastoid air-cells and included paranasal sinuses
are well-aerated. Small mucous retention cyst in the anterior
lateral left maxillary sinus.The included ocular globes and orbital
contents are non-suspicious.

OTHER: None.
IMPRESSION: No acute intracranial abnormality.  Mild superficial atrophy.

## 2017-11-14 ENCOUNTER — Other Ambulatory Visit: Payer: Self-pay | Admitting: Family Medicine

## 2018-02-07 ENCOUNTER — Other Ambulatory Visit: Payer: Self-pay | Admitting: Family Medicine

## 2018-02-09 NOTE — Telephone Encounter (Signed)
Last office visit 11/20/2016.  No future appointments.  Refill?

## 2018-05-16 ENCOUNTER — Telehealth: Payer: Self-pay | Admitting: Family Medicine

## 2018-05-17 NOTE — Telephone Encounter (Signed)
Ok to refill 90 day supply on both, but I have not seen him in 18 months. Needs cpx in the next 3 months

## 2018-05-18 NOTE — Telephone Encounter (Signed)
Erik Edwards,  Please schedule Medicare Wellness with Lattie Haw and CPE with Dr. Lorelei Pont in the next 3 months.

## 2018-05-19 NOTE — Telephone Encounter (Signed)
Erik Edwards,  Please schedule Medicare Wellness with Lattie Haw and CPE with Dr. Lorelei Pont in the next 3 months.

## 2018-05-19 NOTE — Telephone Encounter (Signed)
Left message asking pt to call office  °

## 2018-05-20 NOTE — Telephone Encounter (Signed)
Jeani Hawking spoke with pt   He will call back to schedule

## 2018-05-21 ENCOUNTER — Ambulatory Visit: Payer: Self-pay | Admitting: Family Medicine

## 2018-06-12 ENCOUNTER — Ambulatory Visit: Payer: Medicare Other

## 2018-06-15 ENCOUNTER — Ambulatory Visit (INDEPENDENT_AMBULATORY_CARE_PROVIDER_SITE_OTHER): Payer: Medicare Other

## 2018-06-15 VITALS — BP 130/82 | HR 75 | Temp 98.6°F | Ht 74.5 in | Wt 257.5 lb

## 2018-06-15 DIAGNOSIS — Z0000 Encounter for general adult medical examination without abnormal findings: Secondary | ICD-10-CM

## 2018-06-15 DIAGNOSIS — K760 Fatty (change of) liver, not elsewhere classified: Secondary | ICD-10-CM | POA: Diagnosis not present

## 2018-06-15 DIAGNOSIS — Z125 Encounter for screening for malignant neoplasm of prostate: Secondary | ICD-10-CM

## 2018-06-15 DIAGNOSIS — I700 Atherosclerosis of aorta: Secondary | ICD-10-CM

## 2018-06-15 DIAGNOSIS — Z23 Encounter for immunization: Secondary | ICD-10-CM | POA: Diagnosis not present

## 2018-06-15 DIAGNOSIS — R739 Hyperglycemia, unspecified: Secondary | ICD-10-CM

## 2018-06-15 LAB — HEPATIC FUNCTION PANEL
ALBUMIN: 4.6 g/dL (ref 3.5–5.2)
ALT: 25 U/L (ref 0–53)
AST: 18 U/L (ref 0–37)
Alkaline Phosphatase: 77 U/L (ref 39–117)
Bilirubin, Direct: 0.2 mg/dL (ref 0.0–0.3)
TOTAL PROTEIN: 6.7 g/dL (ref 6.0–8.3)
Total Bilirubin: 1 mg/dL (ref 0.2–1.2)

## 2018-06-15 LAB — CBC WITH DIFFERENTIAL/PLATELET
Basophils Absolute: 0.1 10*3/uL (ref 0.0–0.1)
Basophils Relative: 0.8 % (ref 0.0–3.0)
EOS PCT: 2.9 % (ref 0.0–5.0)
Eosinophils Absolute: 0.2 10*3/uL (ref 0.0–0.7)
HEMATOCRIT: 50.3 % (ref 39.0–52.0)
HEMOGLOBIN: 17.4 g/dL — AB (ref 13.0–17.0)
LYMPHS ABS: 1.5 10*3/uL (ref 0.7–4.0)
Lymphocytes Relative: 21.9 % (ref 12.0–46.0)
MCHC: 34.5 g/dL (ref 30.0–36.0)
MCV: 97.8 fl (ref 78.0–100.0)
Monocytes Absolute: 1 10*3/uL (ref 0.1–1.0)
Monocytes Relative: 14.4 % — ABNORMAL HIGH (ref 3.0–12.0)
Neutro Abs: 4 10*3/uL (ref 1.4–7.7)
Neutrophils Relative %: 60 % (ref 43.0–77.0)
Platelets: 277 10*3/uL (ref 150.0–400.0)
RBC: 5.15 Mil/uL (ref 4.22–5.81)
RDW: 14 % (ref 11.5–15.5)
WBC: 6.7 10*3/uL (ref 4.0–10.5)

## 2018-06-15 LAB — BASIC METABOLIC PANEL
BUN: 15 mg/dL (ref 6–23)
CO2: 30 mEq/L (ref 19–32)
Calcium: 9.9 mg/dL (ref 8.4–10.5)
Chloride: 103 mEq/L (ref 96–112)
Creatinine, Ser: 1.21 mg/dL (ref 0.40–1.50)
GFR: 62.71 mL/min (ref >60.00)
GLUCOSE: 108 mg/dL — AB (ref 70–99)
Potassium: 5.1 mEq/L (ref 3.5–5.1)
Sodium: 140 mEq/L (ref 135–145)

## 2018-06-15 LAB — LIPID PANEL
Cholesterol: 213 mg/dL — ABNORMAL HIGH (ref 0–200)
HDL: 43 mg/dL (ref >39.00)
LDL Cholesterol: 133 mg/dL — ABNORMAL HIGH (ref 0–99)
NONHDL: 169.67
Total CHOL/HDL Ratio: 5
Triglycerides: 184 mg/dL — ABNORMAL HIGH (ref 0.0–149.0)
VLDL: 36.8 mg/dL (ref 0.0–40.0)

## 2018-06-15 LAB — PSA: PSA: 1.57 ng/mL (ref 0.10–4.00)

## 2018-06-15 NOTE — Patient Instructions (Signed)
Erik Edwards , Thank you for taking time to come for your Medicare Wellness Visit. I appreciate your ongoing commitment to your health goals. Please review the following plan we discussed and let me know if I can assist you in the future.   These are the goals we discussed: Goals    . Increase physical activity     Starting 06/15/2018, I will walk for 30 min daily as weather permits.        This is a list of the screening recommended for you and due dates:  Health Maintenance  Topic Date Due  . Colon Cancer Screening  06/11/2020  . Tetanus Vaccine  07/01/2026  . Flu Shot  Completed  .  Hepatitis C: One time screening is recommended by Center for Disease Control  (CDC) for  adults born from [birth year] through 1964.   Completed  . Pneumonia vaccines  Completed   Preventive Care for Adults  A healthy lifestyle and preventive care can promote health and wellness. Preventive health guidelines for adults include the following key practices.  . A routine yearly physical is a good way to check with your health care provider about your health and preventive screening. It is a chance to share any concerns and updates on your health and to receive a thorough exam.  . Visit your dentist for a routine exam and preventive care every 6 months. Brush your teeth twice a day and floss once a day. Good oral hygiene prevents tooth decay and gum disease.  . The frequency of eye exams is based on your age, health, family medical history, use  of contact lenses, and other factors. Follow your health care provider's recommendations for frequency of eye exams.  . Eat a healthy diet. Foods like vegetables, fruits, whole grains, low-fat dairy products, and lean protein foods contain the nutrients you need without too many calories. Decrease your intake of foods high in solid fats, added sugars, and salt. Eat the right amount of calories for you. Get information about a proper diet from your health care provider, if  necessary.  . Regular physical exercise is one of the most important things you can do for your health. Most adults should get at least 150 minutes of moderate-intensity exercise (any activity that increases your heart rate and causes you to sweat) each week. In addition, most adults need muscle-strengthening exercises on 2 or more days a week.  Silver Sneakers may be a benefit available to you. To determine eligibility, you may visit the website: www.silversneakers.com or contact program at 623-096-7246 Mon-Fri between 8AM-8PM.   . Maintain a healthy weight. The body mass index (BMI) is a screening tool to identify possible weight problems. It provides an estimate of body fat based on height and weight. Your health care provider can find your BMI and can help you achieve or maintain a healthy weight.   For adults 20 years and older: ? A BMI below 18.5 is considered underweight. ? A BMI of 18.5 to 24.9 is normal. ? A BMI of 25 to 29.9 is considered overweight. ? A BMI of 30 and above is considered obese.   . Maintain normal blood lipids and cholesterol levels by exercising and minimizing your intake of saturated fat. Eat a balanced diet with plenty of fruit and vegetables. Blood tests for lipids and cholesterol should begin at age 25 and be repeated every 5 years. If your lipid or cholesterol levels are high, you are over 50, or you are at  high risk for heart disease, you may need your cholesterol levels checked more frequently. Ongoing high lipid and cholesterol levels should be treated with medicines if diet and exercise are not working.  . If you smoke, find out from your health care provider how to quit. If you do not use tobacco, please do not start.  . If you choose to drink alcohol, please do not consume more than 2 drinks per day. One drink is considered to be 12 ounces (355 mL) of beer, 5 ounces (148 mL) of wine, or 1.5 ounces (44 mL) of liquor.  . If you are 37-25 years old, ask your  health care provider if you should take aspirin to prevent strokes.  . Use sunscreen. Apply sunscreen liberally and repeatedly throughout the day. You should seek shade when your shadow is shorter than you. Protect yourself by wearing long sleeves, pants, a wide-brimmed hat, and sunglasses year round, whenever you are outdoors.  . Once a month, do a whole body skin exam, using a mirror to look at the skin on your back. Tell your health care provider of new moles, moles that have irregular borders, moles that are larger than a pencil eraser, or moles that have changed in shape or color.

## 2018-06-15 NOTE — Progress Notes (Signed)
Subjective:   Erik Edwards is a 72 y.o. male who presents for Medicare Annual/Subsequent preventive examination.  Review of Systems:  N/A Cardiac Risk Factors include: advanced age (>36men, >26 women);male gender;obesity (BMI >30kg/m2)     Objective:    Vitals: BP 130/82 (BP Location: Right Arm, Patient Position: Sitting, Cuff Size: Large)   Pulse 75   Temp 98.6 F (37 C) (Oral)   Ht 6' 2.5" (1.892 m) Comment: shoes  Wt 257 lb 8 oz (116.8 kg)   SpO2 96%   BMI 32.62 kg/m   Body mass index is 32.62 kg/m.  Advanced Directives 06/15/2018 01/02/2017 11/20/2016 11/05/2016 06/30/2016  Does Patient Have a Medical Advance Directive? No No Yes No No  Type of Advance Directive - Public librarian;Living will - -  Copy of [place] in Chart? - - No - copy requested - -  Would patient like information on creating a medical advance directive? No - Patient declined No - Patient declined - - No - patient declined information    Tobacco Social History   Tobacco Use  Smoking Status Former Smoker  Smokeless Tobacco Never Used     Counseling given: No   Clinical Intake:  Pre-visit preparation completed: Yes  Pain : No/denies pain Pain Score: 0-No pain     Nutritional Status: BMI > 30  Obese Nutritional Risks: None Diabetes: No  How often do you need to have someone help you when you read instructions, pamphlets, or other written materials from your doctor or pharmacy?: 1 - Never What is the last grade level you completed in school?: 12th grade + tech school  Interpreter Needed?: No  Comments: pt lives with spouse Information entered by :: LPinson, LPN  Past Medical History:  Diagnosis Date  . Diverticulosis   . GERD (gastroesophageal reflux disease) 12/02/2013  . History of chicken pox   . Inguinal hernia 01/03/2016   bilateral -repair  . Syncope 07/01/2016  . UTI (urinary tract infection)    Past Surgical History:  Procedure  Laterality Date  . INGUINAL HERNIA REPAIR Bilateral 01/02/2017   Procedure: LAPAROSCOPIC EXPLORATION AND REPAIR OF BILATERAL INGUINAL HERNIA;  Surgeon: Michael Boston, MD;  Location: [facility];  Service: General;  Laterality: Bilateral;  . INSERTION OF MESH Bilateral 01/02/2017   Procedure: INSERTION OF MESH;  Surgeon: Michael Boston, MD;  Location: [facility];  Service: General;  Laterality: Bilateral;   Family History  Problem Relation Age of Onset  . Lymphoma Father    Social History   Socioeconomic History  . Marital status: Married    Spouse name: Not on file  . Number of children: 2  . Years of education: Not on file  . Highest education level: Not on file  Occupational History  . Occupation: retired  Scientific laboratory technician  . Financial resource strain: Not on file  . Food insecurity:    Worry: Not on file    Inability: Not on file  . Transportation needs:    Medical: Not on file    Non-medical: Not on file  Tobacco Use  . Smoking status: Former Research scientist (life sciences)  . Smokeless tobacco: Never Used  Substance and Sexual Activity  . Alcohol use: No  . Drug use: No  . Sexual activity: Not on file  Lifestyle  . Physical activity:    Days per week: Not on file    Minutes per session: Not on file  . Stress: Not on  file  Relationships  . Social connections:    Talks on phone: Not on file    Gets together: Not on file    Attends religious service: Not on file    Active member of club or organization: Not on file    Attends meetings of clubs or organizations: Not on file    Relationship status: Not on file  Other Topics Concern  . Not on file  Social History Narrative  . Not on file    Outpatient Encounter Medications as of 06/15/2018  Medication Sig  . aspirin EC 81 MG tablet Take 81 mg by mouth every morning.  . diazepam (VALIUM) 5 MG tablet Take 1 tablet (5 mg total) by mouth every 6 (six) hours as needed for muscle spasms (dufficulty urinating).  .  famotidine (PEPCID) 20 MG tablet TAKE 1 TABLET BY MOUTH TWO  TIMES DAILY  . Multiple Vitamins-Minerals (MULTIVITAMINS THER. W/MINERALS) TABS Take 1 tablet by mouth daily.  Marland Kitchen omeprazole (PRILOSEC) 20 MG capsule TAKE 1 CAPSULE BY MOUTH  DAILY  . [DISCONTINUED] naproxen (NAPROSYN) 500 MG tablet Take 1 tablet (500 mg total) by mouth every 12 (twelve) hours as needed for mild pain or moderate pain.  . [DISCONTINUED] traMADol (ULTRAM) 50 MG tablet Take 1-2 tablets (50-100 mg total) by mouth every 6 (six) hours as needed for moderate pain or severe pain.   No facility-administered encounter medications on file as of 06/15/2018.     Activities of Daily Living In your present state of health, do you have any difficulty performing the following activities: 06/15/2018  Hearing? N  Vision? N  Difficulty concentrating or making decisions? N  Walking or climbing stairs? N  Dressing or bathing? N  Doing errands, shopping? N  Preparing Food and eating ? N  Using the Toilet? N  In the past six months, have you accidently leaked urine? N  Do you have problems with loss of bowel control? N  Managing your Medications? N  Managing your Finances? N  Housekeeping or managing your Housekeeping? N  Some recent data might be hidden    Patient Care Team: Owens Loffler, MD as PCP - Huston Foley, MD as Consulting Physician (General Surgery) Sherren Mocha, MD as Consulting Physician (Cardiology)   Assessment:   This is a routine wellness examination for Erik Edwards.   Hearing Screening   125Hz  250Hz  500Hz  1000Hz  2000Hz  3000Hz  4000Hz  6000Hz  8000Hz   Right ear:   40 40 40  0    Left ear:   40 40 40  0      Visual Acuity Screening   Right eye Left eye Both eyes  Without correction:     With correction: 20/30 20/30 20/30      Exercise Activities and Dietary recommendations Current Exercise Habits: Home exercise routine, Type of exercise: treadmill, Time (Minutes): 30, Frequency (Times/Week): 4,  Weekly Exercise (Minutes/Week): 120, Intensity: Moderate, Exercise limited by: None identified  Goals    . Increase physical activity     Starting 06/15/2018, I will walk for 30 min daily as weather permits.        Fall Risk Fall Risk  06/15/2018 11/20/2016 03/27/2015 12/01/2013  Falls in the past year? No Yes No No  Comment - Nov 2017; pt passed out and fell secondary to UTI - -  Number falls in past yr: - 1 - -  Injury with Fall? - Yes - -  Comment - bruise to nose; no head trauma - -   Depression  Screen PHQ 2/9 Scores 06/15/2018 11/20/2016 11/20/2016 03/27/2015  PHQ - 2 Score 0 0 0 0  PHQ- 9 Score 0 - - -    Cognitive Function MMSE - Mini Mental State Exam 06/15/2018 11/20/2016 11/20/2016  Orientation to time 5 5 5   Orientation to Place 5 5 5   Registration 3 3 3   Attention/ Calculation 0 0 0  Recall 3 3 3   Language- name 2 objects 0 0 0  Language- repeat 1 1 1   Language- follow 3 step command 3 3 3   Language- read & follow direction 0 0 0  Write a sentence 0 0 0  Copy design 0 0 0  Total score 20 20 20      PLEASE NOTE: A Mini-Cog screen was completed. Maximum score is 20. A value of 0 denotes this part of Folstein MMSE was not completed or the patient failed this part of the Mini-Cog screening.   Mini-Cog Screening Orientation to Time - Max 5 pts Orientation to Place - Max 5 pts Registration - Max 3 pts Recall - Max 3 pts Language Repeat - Max 1 pts Language Follow 3 Step Command - Max 3 pts     Immunization History  Administered Date(s) Administered  . Influenza, Seasonal, Injecte, Preservative Fre 07/27/2012  . Influenza,inj,Quad PF,6+ Mos 06/17/2013, 06/14/2014, 06/27/2015, 07/08/2016, 06/20/2017, 06/15/2018  . Pneumococcal Conjugate-13 12/01/2013  . Pneumococcal Polysaccharide-23 07/27/2012  . Tdap 07/01/2016  . Zoster 07/27/2012    Screening Tests Health Maintenance  Topic Date Due  . COLONOSCOPY  06/11/2020  . TETANUS/TDAP  07/01/2026  . INFLUENZA  VACCINE  Completed  . Hepatitis C Screening  Completed  . PNA vac Low Risk Adult  Completed      Plan:     I have personally reviewed, addressed, and noted the following in the patient's chart:  A. Medical and social history B. Use of alcohol, tobacco or illicit drugs  C. Current medications and supplements D. Functional ability and status E.  Nutritional status F.  Physical activity G. Advance directives H. List of other physicians I.  Hospitalizations, surgeries, and ER visits in previous 12 months J.  [place] to include hearing, vision, cognitive, depression L. Referrals and appointments - none  In addition, I have reviewed and discussed with patient certain preventive protocols, quality metrics, and best practice recommendations. A written personalized care plan for preventive services as well as general preventive health recommendations were provided to patient.  See attached scanned questionnaire for additional information.   Signed,   Lindell Noe, MHA, BS, LPN Health Coach

## 2018-06-16 NOTE — Progress Notes (Signed)
PCP notes:   Health maintenance:  Flu vaccine - administered  Abnormal screenings:   Hearing - failed  Hearing Screening   125Hz  250Hz  500Hz  1000Hz  2000Hz  3000Hz  4000Hz  6000Hz  8000Hz   Right ear:   40 40 40  0    Left ear:   40 40 40  0     Patient concerns:   Wants to discuss effectiveness of taking Aspirin 81 mg  Nurse concerns:  None  Next PCP appt:   06/17/18 @ 1015

## 2018-06-16 NOTE — Progress Notes (Signed)
I reviewed health advisor's note, was available for consultation, and agree with documentation and plan.  

## 2018-06-17 ENCOUNTER — Encounter: Payer: Medicare Other | Admitting: Family Medicine

## 2018-06-17 ENCOUNTER — Ambulatory Visit (INDEPENDENT_AMBULATORY_CARE_PROVIDER_SITE_OTHER): Payer: Medicare Other | Admitting: Family Medicine

## 2018-06-17 ENCOUNTER — Encounter: Payer: Self-pay | Admitting: *Deleted

## 2018-06-17 ENCOUNTER — Encounter: Payer: Self-pay | Admitting: Family Medicine

## 2018-06-17 VITALS — BP 132/70 | HR 75 | Temp 99.2°F | Ht 74.5 in | Wt 259.5 lb

## 2018-06-17 DIAGNOSIS — K219 Gastro-esophageal reflux disease without esophagitis: Secondary | ICD-10-CM | POA: Diagnosis not present

## 2018-06-17 DIAGNOSIS — R7303 Prediabetes: Secondary | ICD-10-CM | POA: Insufficient documentation

## 2018-06-17 DIAGNOSIS — E669 Obesity, unspecified: Secondary | ICD-10-CM

## 2018-06-17 DIAGNOSIS — Z0000 Encounter for general adult medical examination without abnormal findings: Secondary | ICD-10-CM

## 2018-06-17 DIAGNOSIS — E7800 Pure hypercholesterolemia, unspecified: Secondary | ICD-10-CM | POA: Diagnosis not present

## 2018-06-17 NOTE — Assessment & Plan Note (Signed)
Info on low glycemic diet given. Re-eval in 3 months.

## 2018-06-17 NOTE — Assessment & Plan Note (Signed)
LDL not at goal . 22% risk of CVD in next 10 years per [facility] risk calculator. Plus aortic athersclerosis seen on CT.  Continue ASA and  Work on low chol diet. Info given.  Re-eval in 3 months.

## 2018-06-17 NOTE — Assessment & Plan Note (Signed)
Weell controlled on PPI and H2 blocker.

## 2018-06-17 NOTE — Patient Instructions (Signed)
Work on low cholesterol diet, continue exercise.

## 2018-06-17 NOTE — Progress Notes (Signed)
Subjective:    Patient ID: Erik Edwards, male    DOB: 1945-09-16, 72 y.o.   MRN: [id number]  HPI   The patient presents for complete physical and review of chronic health problems. He/She also has the following acute concerns today:none  The patient saw Candis Musa, LPN for medicare wellness. Note reviewed in detail and important notes copied below.  Health maintenance: Flu vaccine - administered  Abnormal screenings:  Hearing - failed             Hearing Screening   125Hz  250Hz  500Hz  1000Hz  2000Hz  3000Hz  4000Hz  6000Hz  8000Hz   Right ear:   40 40 40  0    Left ear:   40 40 40  0      06/17/18 Today:   Aortic atherosclerosis/calcification: recommended continuing ASA daily. Low risk for falls.   GERD:Using prilosec and pecid daily.. Well controlled.  Reviewed labs in detail.   LDL above goal at 133.  AHA riak calculator 22% risk of CVd in next 10 years. Statin indicated. Pt wishes to make dietary changes first and recheck in 3 months..   Eats what he wants not on low fat diet. Exercise: [employer] 4 days week, treadmill. Lab Results  Component Value Date   CHOL 213 (H) 06/15/2018   HDL 43.00 06/15/2018   LDLCALC 133 (H) 06/15/2018   LDLDIRECT 155.6 07/20/2012   TRIG 184.0 (H) 06/15/2018   CHOLHDL 5 06/15/2018    Prediabetes: glucose 108  Social History /Family History/Past Medical History reviewed in detail and updated in EMR if needed. Blood pressure 132/70, pulse 75, temperature 99.2 F (37.3 C), temperature source Oral, height 6' 2.5" (1.892 m), weight 259 lb 8 oz (117.7 kg).    Review of Systems  Constitutional: Negative for fatigue and fever.  HENT: Negative for ear pain.   Eyes: Negative for pain.  Respiratory: Negative for cough and shortness of breath.   Cardiovascular: Negative for chest pain, palpitations and leg swelling.  Gastrointestinal: Negative for abdominal pain.  Genitourinary: Negative for dysuria.  Musculoskeletal: Negative  for arthralgias.  Neurological: Negative for syncope, light-headedness and headaches.  Psychiatric/Behavioral: Negative for dysphoric mood.       Objective:   Physical Exam  Constitutional: Vital signs are normal. He appears well-developed and well-nourished.  Central obesity  HENT:  Head: Normocephalic.  Right Ear: Hearing normal.  Left Ear: Hearing normal.  Nose: Nose normal.  Mouth/Throat: Oropharynx is clear and moist and mucous membranes are normal.  Neck: Trachea normal. Carotid bruit is not present. No thyroid mass and no thyromegaly present.  Cardiovascular: Normal rate, regular rhythm and normal pulses. Exam reveals no gallop, no distant heart sounds and no friction rub.  No murmur heard. No peripheral edema  Pulmonary/Chest: Effort normal and breath sounds normal. No respiratory distress.  Skin: Skin is warm, dry and intact. No rash noted.  Psychiatric: He has a normal mood and affect. His speech is normal and behavior is normal. Thought content normal.          Assessment & Plan:  The patient's preventative maintenance and recommended screening tests for an annual wellness exam were reviewed in full today. Brought up to date unless services declined.  Counselled on the importance of diet, exercise, and its role in overall health and mortality. The patient's FH and SH was reviewed, including their home life, tobacco status, and drug and alcohol status.    Vaccines: uptodate Prostate Cancer Screen: Lab Results  Component Value Date  PSA 1.57 06/15/2018   PSA 1.04 03/23/2015   PSA 0.99 11/01/2013  Colon Cancer Screen:  2016 colonoscopy repeat due in 5 years.      Smoking Status: former smoker  Hep C:  done

## 2018-06-17 NOTE — Assessment & Plan Note (Signed)
Body mass index is 32.87 kg/m².

## 2018-09-14 ENCOUNTER — Other Ambulatory Visit: Payer: Self-pay | Admitting: Family Medicine

## 2018-10-08 DIAGNOSIS — D045 Carcinoma in situ of skin of trunk: Secondary | ICD-10-CM | POA: Diagnosis not present

## 2019-02-12 ENCOUNTER — Other Ambulatory Visit: Payer: Self-pay | Admitting: Family Medicine

## 2019-05-06 ENCOUNTER — Other Ambulatory Visit: Payer: Self-pay | Admitting: Family Medicine

## 2019-05-28 DIAGNOSIS — Z23 Encounter for immunization: Secondary | ICD-10-CM | POA: Diagnosis not present

## 2019-06-17 ENCOUNTER — Other Ambulatory Visit (INDEPENDENT_AMBULATORY_CARE_PROVIDER_SITE_OTHER): Payer: Medicare Other

## 2019-06-17 ENCOUNTER — Ambulatory Visit (INDEPENDENT_AMBULATORY_CARE_PROVIDER_SITE_OTHER): Payer: Medicare Other

## 2019-06-17 ENCOUNTER — Ambulatory Visit: Payer: Self-pay

## 2019-06-17 DIAGNOSIS — R7303 Prediabetes: Secondary | ICD-10-CM | POA: Diagnosis not present

## 2019-06-17 DIAGNOSIS — Z0000 Encounter for general adult medical examination without abnormal findings: Secondary | ICD-10-CM

## 2019-06-17 DIAGNOSIS — Z79899 Other long term (current) drug therapy: Secondary | ICD-10-CM | POA: Diagnosis not present

## 2019-06-17 DIAGNOSIS — E7800 Pure hypercholesterolemia, unspecified: Secondary | ICD-10-CM | POA: Diagnosis not present

## 2019-06-17 LAB — BASIC METABOLIC PANEL
BUN: 16 mg/dL (ref 6–23)
CO2: 30 mEq/L (ref 19–32)
Calcium: 9.5 mg/dL (ref 8.4–10.5)
Chloride: 103 mEq/L (ref 96–112)
Creatinine, Ser: 1.19 mg/dL (ref 0.40–1.50)
GFR: 59.98 mL/min — ABNORMAL LOW (ref >60.00)
Glucose, Bld: 105 mg/dL — ABNORMAL HIGH (ref 70–99)
Potassium: 4.3 mEq/L (ref 3.5–5.1)
Sodium: 140 mEq/L (ref 135–145)

## 2019-06-17 LAB — CBC
HCT: 47.4 % (ref 39.0–52.0)
Hemoglobin: 16 g/dL (ref 13.0–17.0)
MCHC: 33.7 g/dL (ref 30.0–36.0)
MCV: 98.7 fl (ref 78.0–100.0)
Platelets: 270 10*3/uL (ref 150.0–400.0)
RBC: 4.8 Mil/uL (ref 4.22–5.81)
RDW: 14 % (ref 11.5–15.5)
WBC: 6.4 10*3/uL (ref 4.0–10.5)

## 2019-06-17 LAB — HEPATIC FUNCTION PANEL
ALT: 20 U/L (ref 0–53)
AST: 20 U/L (ref 0–37)
Albumin: 4.4 g/dL (ref 3.5–5.2)
Alkaline Phosphatase: 75 U/L (ref 39–117)
Bilirubin, Direct: 0.2 mg/dL (ref 0.0–0.3)
Total Bilirubin: 1 mg/dL (ref 0.2–1.2)
Total Protein: 6.6 g/dL (ref 6.0–8.3)

## 2019-06-17 LAB — LIPID PANEL
Cholesterol: 191 mg/dL (ref 0–200)
HDL: 43.6 mg/dL (ref >39.00)
LDL Cholesterol: 124 mg/dL — ABNORMAL HIGH (ref 0–99)
NonHDL: 147.44
Total CHOL/HDL Ratio: 4
Triglycerides: 119 mg/dL (ref 0.0–149.0)
VLDL: 23.8 mg/dL (ref 0.0–40.0)

## 2019-06-17 LAB — HEMOGLOBIN A1C: Hgb A1c MFr Bld: 5.4 % (ref 4.6–6.5)

## 2019-06-17 NOTE — Progress Notes (Signed)
PCP notes:  Health Maintenance: Advised to check with pharmacy about Shingrix vaccine    Abnormal Screenings: none    Patient concerns: none    Nurse concerns: none    Next PCP appt.: 1026/2020 @ 9 am

## 2019-06-17 NOTE — Patient Instructions (Signed)
[name] , Thank you for taking time to come for your Medicare Wellness Visit. I appreciate your ongoing commitment to your health goals. Please review the following plan we discussed and let me know if I can assist you in the future.   Screening recommendations/referrals: Colonoscopy: completed 06/12/2015 Recommended yearly ophthalmology/optometry visit for glaucoma screening and checkup Recommended yearly dental visit for hygiene and checkup  Vaccinations: Influenza vaccine: up to date, completed 05/28/2019 Pneumococcal vaccine: Completed series Tdap vaccine: up to date, completed 07/01/2016 Shingles vaccine: will check with pharmacy    Advanced directives: Please bring a copy of your POA (Power of [place]) and/or Living Will to your next appointment.   Conditions/risks identified: hypercholesterolemia   Next appointment: 06/21/2019 @ 9 am   Preventive Care 72 Years and Older, Male Preventive care refers to lifestyle choices and visits with your health care provider that can promote health and wellness. What does preventive care include?  A yearly physical exam. This is also called an annual well check.  Dental exams once or twice a year.  Routine eye exams. Ask your health care provider how often you should have your eyes checked.  Personal lifestyle choices, including:  Daily care of your teeth and gums.  Regular physical activity.  Eating a healthy diet.  Avoiding tobacco and drug use.  Limiting alcohol use.  Practicing safe sex.  Taking low doses of aspirin every day.  Taking vitamin and mineral supplements as recommended by your health care provider. What happens during an annual well check? The services and screenings done by your health care provider during your annual well check will depend on your age, overall health, lifestyle risk factors, and family history of disease. Counseling  Your health care provider may ask you questions about your:  Alcohol  use.  Tobacco use.  Drug use.  Emotional well-being.  Home and relationship well-being.  Sexual activity.  Eating habits.  History of falls.  Memory and ability to understand (cognition).  Work and work Statistician. Screening  You may have the following tests or measurements:  Height, weight, and BMI.  Blood pressure.  Lipid and cholesterol levels. These may be checked every 5 years, or more frequently if you are over 72 years old.  Skin check.  Lung cancer screening. You may have this screening every year starting at age 72 if you have a 30-pack-year history of smoking and currently smoke or have quit within the past 15 years.  Fecal occult blood test (FOBT) of the stool. You may have this test every year starting at age 72.  Flexible sigmoidoscopy or colonoscopy. You may have a sigmoidoscopy every 5 years or a colonoscopy every 10 years starting at age 72.  Prostate cancer screening. Recommendations will vary depending on your family history and other risks.  Hepatitis C blood test.  Hepatitis B blood test.  Sexually transmitted disease (STD) testing.  Diabetes screening. This is done by checking your blood sugar (glucose) after you have not eaten for a while (fasting). You may have this done every 1-3 years.  Abdominal aortic aneurysm (AAA) screening. You may need this if you are a current or former smoker.  Osteoporosis. You may be screened starting at age 72 if you are at high risk. Talk with your health care provider about your test results, treatment options, and if necessary, the need for more tests. Vaccines  Your health care provider may recommend certain vaccines, such as:  Influenza vaccine. This is recommended every year.  Tetanus, diphtheria, and acellular pertussis (Tdap, Td) vaccine. You may need a Td booster every 10 years.  Zoster vaccine. You may need this after age 72.  Pneumococcal 13-valent conjugate (PCV13) vaccine. One dose is  recommended after age 72.  Pneumococcal polysaccharide (PPSV23) vaccine. One dose is recommended after age 72. Talk to your health care provider about which screenings and vaccines you need and how often you need them. This information is not intended to replace advice given to you by your health care provider. Make sure you discuss any questions you have with your health care provider. Document Released: 09/08/2015 Document Revised: 05/01/2016 Document Reviewed: 06/13/2015 Elsevier Interactive Patient Education  2017 [place] Prevention in the Home Falls can cause injuries. They can happen to people of all ages. There are many things you can do to make your home safe and to help prevent falls. What can I do on the outside of my home?  Regularly fix the edges of walkways and driveways and fix any cracks.  Remove anything that might make you trip as you walk through a door, such as a raised step or threshold.  Trim any bushes or trees on the path to your home.  Use bright outdoor lighting.  Clear any walking paths of anything that might make someone trip, such as rocks or tools.  Regularly check to see if handrails are loose or broken. Make sure that both sides of any steps have handrails.  Any raised decks and porches should have guardrails on the edges.  Have any leaves, snow, or ice cleared regularly.  Use sand or salt on walking paths during winter.  Clean up any spills in your garage right away. This includes oil or grease spills. What can I do in the bathroom?  Use night lights.  Install grab bars by the toilet and in the tub and shower. Do not use towel bars as grab bars.  Use non-skid mats or decals in the tub or shower.  If you need to sit down in the shower, use a plastic, non-slip stool.  Keep the floor dry. Clean up any water that spills on the floor as soon as it happens.  Remove soap buildup in the tub or shower regularly.  Attach bath mats  securely with double-sided non-slip rug tape.  Do not have throw rugs and other things on the floor that can make you trip. What can I do in the bedroom?  Use night lights.  Make sure that you have a light by your bed that is easy to reach.  Do not use any sheets or blankets that are too big for your bed. They should not hang down onto the floor.  Have a firm chair that has side arms. You can use this for support while you get dressed.  Do not have throw rugs and other things on the floor that can make you trip. What can I do in the kitchen?  Clean up any spills right away.  Avoid walking on wet floors.  Keep items that you use a lot in easy-to-reach places.  If you need to reach something above you, use a strong step stool that has a grab bar.  Keep electrical cords out of the way.  Do not use floor polish or wax that makes floors slippery. If you must use wax, use non-skid floor wax.  Do not have throw rugs and other things on the floor that can make you trip. What can I do  with my stairs?  Do not leave any items on the stairs.  Make sure that there are handrails on both sides of the stairs and use them. Fix handrails that are broken or loose. Make sure that handrails are as long as the stairways.  Check any carpeting to make sure that it is firmly attached to the stairs. Fix any carpet that is loose or worn.  Avoid having throw rugs at the top or bottom of the stairs. If you do have throw rugs, attach them to the floor with carpet tape.  Make sure that you have a light switch at the top of the stairs and the bottom of the stairs. If you do not have them, ask someone to add them for you. What else can I do to help prevent falls?  Wear shoes that:  Do not have high heels.  Have rubber bottoms.  Are comfortable and fit you well.  Are closed at the toe. Do not wear sandals.  If you use a stepladder:  Make sure that it is fully opened. Do not climb a closed  stepladder.  Make sure that both sides of the stepladder are locked into place.  Ask someone to hold it for you, if possible.  Clearly mark and make sure that you can see:  Any grab bars or handrails.  First and last steps.  Where the edge of each step is.  Use tools that help you move around (mobility aids) if they are needed. These include:  Canes.  Walkers.  Scooters.  Crutches.  Turn on the lights when you go into a dark area. Replace any light bulbs as soon as they burn out.  Set up your furniture so you have a clear path. Avoid moving your furniture around.  If any of your floors are uneven, fix them.  If there are any pets around you, be aware of where they are.  Review your medicines with your doctor. Some medicines can make you feel dizzy. This can increase your chance of falling. Ask your doctor what other things that you can do to help prevent falls. This information is not intended to replace advice given to you by your health care provider. Make sure you discuss any questions you have with your health care provider. Document Released: 06/08/2009 Document Revised: 01/18/2016 Document Reviewed: 09/16/2014 Elsevier Interactive Patient Education  2017 [employer].

## 2019-06-17 NOTE — Addendum Note (Signed)
Addended by: Cloyd Stagers on: 06/17/2019 11:28 AM   Modules accepted: Orders

## 2019-06-17 NOTE — Progress Notes (Signed)
Subjective:   Erik Edwards is a 73 y.o. male who presents for Medicare Annual/Subsequent preventive examination.  Review of Systems:    This visit is being conducted through telemedicine via telephone at the nurse health advisor's home address due to the COVID-19 pandemic. This patient has given me verbal consent via doximity to conduct this visit, patient states they are participating from their home address. Patient and myself are on the telephone call. There is no referral for this visit. Some vital signs may be absent or patient reported.    Patient identification: identified by name, DOB, and current address   Cardiac Risk Factors include: advanced age (>42men, >33 women);male gender;Other (see comment), Risk factor comments: hypercholesterolemia     Objective:    Vitals: There were no vitals taken for this visit.  There is no height or weight on file to calculate BMI.  Advanced Directives 06/17/2019 06/15/2018 01/02/2017 11/20/2016 11/05/2016 06/30/2016  Does Patient Have a Medical Advance Directive? Yes No No Yes No No  Type of Paramedic of [place];Living will - - [place];Living will - -  Copy of [place] in Chart? No - copy requested - - No - copy requested - -  Would patient like information on creating a medical advance directive? - No - Patient declined No - Patient declined - - No - patient declined information    Tobacco Social History   Tobacco Use  Smoking Status Former Smoker  Smokeless Tobacco Never Used     Counseling given: Not Answered   Clinical Intake:  Pre-visit preparation completed: Yes  Pain : No/denies pain     Nutritional Risks: None Diabetes: No  How often do you need to have someone help you when you read instructions, pamphlets, or other written materials from your doctor or pharmacy?: 1 - Never What is the last grade level you completed in school?: some college   Interpreter Needed?: No  Information entered by :: CJohnson, LPN  Past Medical History:  Diagnosis Date  . Diverticulosis   . GERD (gastroesophageal reflux disease) 12/02/2013  . History of chicken pox   . Inguinal hernia 01/03/2016   bilateral -repair  . Syncope 07/01/2016  . UTI (urinary tract infection)    Past Surgical History:  Procedure Laterality Date  . INGUINAL HERNIA REPAIR Bilateral 01/02/2017   Procedure: LAPAROSCOPIC EXPLORATION AND REPAIR OF BILATERAL INGUINAL HERNIA;  Surgeon: Michael Boston, MD;  Location: [facility];  Service: General;  Laterality: Bilateral;  . INSERTION OF MESH Bilateral 01/02/2017   Procedure: INSERTION OF MESH;  Surgeon: Michael Boston, MD;  Location: [facility];  Service: General;  Laterality: Bilateral;   Family History  Problem Relation Age of Onset  . Lymphoma Father    Social History   Socioeconomic History  . Marital status: Married    Spouse name: Not on file  . Number of children: 2  . Years of education: Not on file  . Highest education level: Not on file  Occupational History  . Occupation: retired  Scientific laboratory technician  . Financial resource strain: Not hard at all  . Food insecurity    Worry: Never true    Inability: Never true  . Transportation needs    Medical: No    Non-medical: No  Tobacco Use  . Smoking status: Former Research scientist (life sciences)  . Smokeless tobacco: Never Used  Substance and Sexual Activity  . Alcohol use: No  . Drug use:  No  . Sexual activity: Not on file  Lifestyle  . Physical activity    Days per week: 0 days    Minutes per session: 0 min  . Stress: Not at all  Relationships  . Social Herbalist on phone: Not on file    Gets together: Not on file    Attends religious service: Not on file    Active member of club or organization: Not on file    Attends meetings of clubs or organizations: Not on file    Relationship status: Not on file  Other Topics Concern  . Not on file   Social History Narrative  . Not on file    Outpatient Encounter Medications as of 06/17/2019  Medication Sig  . aspirin EC 81 MG tablet Take 81 mg by mouth every morning.  . famotidine (PEPCID) 20 MG tablet TAKE 1 TABLET BY MOUTH  TWICE DAILY  . Multiple Vitamins-Minerals (MULTIVITAMINS THER. W/MINERALS) TABS Take 1 tablet by mouth daily.  Marland Kitchen omeprazole (PRILOSEC) 20 MG capsule TAKE 1 CAPSULE BY MOUTH  DAILY   No facility-administered encounter medications on file as of 06/17/2019.     Activities of Daily Living In your present state of health, do you have any difficulty performing the following activities: 06/17/2019  Hearing? N  Vision? N  Difficulty concentrating or making decisions? N  Walking or climbing stairs? N  Dressing or bathing? N  Doing errands, shopping? N  Preparing Food and eating ? N  Using the Toilet? N  In the past six months, have you accidently leaked urine? N  Do you have problems with loss of bowel control? N  Managing your Medications? N  Managing your Finances? N  Housekeeping or managing your Housekeeping? N  Some recent data might be hidden    Patient Care Team: Owens Loffler, MD as PCP - Huston Foley, MD as Consulting Physician (General Surgery) Sherren Mocha, MD as Consulting Physician (Cardiology)   Assessment:   This is a routine wellness examination for Erik Edwards.  Exercise Activities and Dietary recommendations Current Exercise Habits: Home exercise routine, Type of exercise: walking, Time (Minutes): 30, Frequency (Times/Week): 3, Weekly Exercise (Minutes/Week): 90, Intensity: Mild, Exercise limited by: None identified  Goals    . Increase physical activity     Starting 06/15/2018, I will walk for 30 min daily as weather permits.     . Patient Stated     06/17/2019, I will increase my exercise by walking more frequently and for longer.        Fall Risk Fall Risk  06/17/2019 06/15/2018 11/20/2016 03/27/2015 12/01/2013  Falls in  the past year? 0 No Yes No No  Comment - - Nov 2017; pt passed out and fell secondary to UTI - -  Number falls in past yr: 0 - 1 - -  Injury with Fall? 0 - Yes - -  Comment - - bruise to nose; no head trauma - -  Risk for fall due to : Medication side effect - - - -  Follow up Falls evaluation completed;Falls prevention discussed - - - -   Is the patient's home free of loose throw rugs in walkways, pet beds, electrical cords, etc?   yes      Grab bars in the bathroom? no      Handrails on the stairs?   no      Adequate lighting?   yes  Timed Get Up and Go Performed: N/A  Depression Screen PHQ 2/9 Scores 06/17/2019 06/15/2018 11/20/2016 11/20/2016  PHQ - 2 Score 0 0 0 0  PHQ- 9 Score 0 0 - -    Cognitive Function MMSE - Mini Mental State Exam 06/17/2019 06/15/2018 11/20/2016 11/20/2016  Orientation to time 5 5 5 5   Orientation to Place 5 5 5 5   Registration 3 3 3 3   Attention/ Calculation 5 0 0 0  Recall 3 3 3 3   Language- name 2 objects - 0 0 0  Language- repeat 1 1 1 1   Language- follow 3 step command - 3 3 3   Language- read & follow direction - 0 0 0  Write a sentence - 0 0 0  Copy design - 0 0 0  Total score - 20 20 20   Mini Cog  Mini-Cog screen was completed. Maximum score is 22. A value of 0 denotes this part of the MMSE was not completed or the patient failed this part of the Mini-Cog screening.      Immunization History  Administered Date(s) Administered  . Influenza, High Dose Seasonal PF 05/28/2019  . Influenza, Seasonal, Injecte, Preservative Fre 07/27/2012  . Influenza,inj,Quad PF,6+ Mos 06/17/2013, 06/14/2014, 06/27/2015, 07/08/2016, 06/20/2017, 06/15/2018  . Pneumococcal Conjugate-13 12/01/2013  . Pneumococcal Polysaccharide-23 07/27/2012  . Tdap 07/01/2016  . Zoster 07/27/2012    Qualifies for Shingles Vaccine? Yes Screening Tests Health Maintenance  Topic Date Due  . COLONOSCOPY  06/11/2020  . TETANUS/TDAP  07/01/2026  . INFLUENZA VACCINE   Completed  . Hepatitis C Screening  Completed  . PNA vac Low Risk Adult  Completed   Cancer Screenings: Lung: Low Dose CT Chest recommended if Age 78-80 years, 30 pack-year currently smoking OR have quit w/in 15years. Patient does not qualify. Colorectal: completed 06/12/2015  Additional Screenings:  Hepatitis C Screening: 11/20/2016      Plan:    Patient will increase exercise by walking more often and for longer.   I have personally reviewed and noted the following in the patient's chart:   . Medical and social history . Use of alcohol, tobacco or illicit drugs  . Current medications and supplements . Functional ability and status . Nutritional status . Physical activity . Advanced directives . List of other physicians . Hospitalizations, surgeries, and ER visits in previous 12 months . Vitals . Screenings to include cognitive, depression, and falls . Referrals and appointments  In addition, I have reviewed and discussed with patient certain preventive protocols, quality metrics, and best practice recommendations. A written personalized care plan for preventive services as well as general preventive health recommendations were provided to patient.     Andrez Grime, LPN  [id number]

## 2019-06-20 NOTE — Progress Notes (Signed)
Darvell Monteforte T. Jadarious Dobbins, MD [facility] [place], [address] Phone: [phone]  FAX: [place] - 73 y.o. male  MRN [id number]  Date of Birth: 11-04-1945  Visit Date: 06/21/2019  PCP: Owens Loffler, MD  Referred by: Owens Loffler, MD  Chief Complaint  Patient presents with  . Annual Exam    Part 2   Patient Care Team: Owens Loffler, MD as PCP - Huston Foley, MD as Consulting Physician (General Surgery) Sherren Mocha, MD as Consulting Physician (Cardiology) Subjective:   Erik Edwards is a 73 y.o. pleasant patient who presents with the following:  Preventative Health Maintenance Visit:  Health Maintenance Summary Reviewed and updated, unless pt declines services.  Tobacco History Reviewed. Alcohol: No concerns, no excessive use Exercise Habits: Some activity, rec at least 30 mins 5 times a week STD concerns: no risk or activity to increase risk Drug Use: None Encouraged self-testicular check  In the morning will have some discharge from his nose.  Overall, he is doing well and generally does not have any complaints.   BP Readings from Last 3 Encounters:  06/21/19 (!) 146/80  06/17/18 132/70  06/15/18 130/82     Health Maintenance  Topic Date Due  . COLONOSCOPY  06/11/2020  . TETANUS/TDAP  07/01/2026  . INFLUENZA VACCINE  Completed  . Hepatitis C Screening  Completed  . PNA vac Low Risk Adult  Completed   Immunization History  Administered Date(s) Administered  . Influenza, High Dose Seasonal PF 05/28/2019  . Influenza, Seasonal, Injecte, Preservative Fre 07/27/2012  . Influenza,inj,Quad PF,6+ Mos 06/17/2013, 06/14/2014, 06/27/2015, 07/08/2016, 06/20/2017, 06/15/2018  . Pneumococcal Conjugate-13 12/01/2013  . Pneumococcal Polysaccharide-23 07/27/2012  . Tdap 07/01/2016  . Zoster 07/27/2012   Patient Active Problem List   Diagnosis  Date Noted  . High cholesterol 06/17/2018  . Prediabetes 06/17/2018  . Aortic calcification ([place]) 11/20/2016  . Bilateral inguinal hernias (direct & indirect) s/p laparoscopic repair with mesh 01/02/2017 11/20/2016  . Fatty liver 11/20/2016  . GERD (gastroesophageal reflux disease) 12/02/2013  . Obesity (BMI 30-39.9) 12/02/2013   Past Medical History:  Diagnosis Date  . Diverticulosis   . GERD (gastroesophageal reflux disease) 12/02/2013  . History of chicken pox   . Inguinal hernia 01/03/2016   bilateral -repair  . Syncope 07/01/2016  . UTI (urinary tract infection)    Past Surgical History:  Procedure Laterality Date  . INGUINAL HERNIA REPAIR Bilateral 01/02/2017   Procedure: LAPAROSCOPIC EXPLORATION AND REPAIR OF BILATERAL INGUINAL HERNIA;  Surgeon: Michael Boston, MD;  Location: [facility];  Service: General;  Laterality: Bilateral;  . INSERTION OF MESH Bilateral 01/02/2017   Procedure: INSERTION OF MESH;  Surgeon: Michael Boston, MD;  Location: [facility];  Service: General;  Laterality: Bilateral;   Social History   Socioeconomic History  . Marital status: Married    Spouse name: Not on file  . Number of children: 2  . Years of education: Not on file  . Highest education level: Not on file  Occupational History  . Occupation: retired  Scientific laboratory technician  . Financial resource strain: Not hard at all  . Food insecurity    Worry: Never true    Inability: Never true  . Transportation needs    Medical: No    Non-medical: No  Tobacco Use  . Smoking status: Former Research scientist (life sciences)  . Smokeless tobacco: Never Used  Substance and Sexual Activity  . Alcohol use: No  . Drug use: No  . Sexual activity: Not on file  Lifestyle  . Physical activity    Days per week: 0 days    Minutes per session: 0 min  . Stress: Not at all  Relationships  . Social Herbalist on phone: Not on file    Gets together: Not on file    Attends religious service: Not on  file    Active member of club or organization: Not on file    Attends meetings of clubs or organizations: Not on file    Relationship status: Not on file  . Intimate partner violence    Fear of current or ex partner: No    Emotionally abused: No    Physically abused: No    Forced sexual activity: No  Other Topics Concern  . Not on file  Social History Narrative  . Not on file   Family History  Problem Relation Age of Onset  . Lymphoma Father    No Known Allergies  Medication list has been reviewed and updated.   General: Denies fever, chills, sweats. No significant weight loss. Eyes: Denies blurring,significant itching ENT: Denies earache, sore throat, and hoarseness. Cardiovascular: Denies chest pains, palpitations, dyspnea on exertion Respiratory: Denies cough, dyspnea at rest,wheeezing Breast: no concerns about lumps GI: Denies nausea, vomiting, diarrhea, constipation, change in bowel habits, abdominal pain, melena, hematochezia GU: Denies penile discharge, ED, urinary flow / outflow problems. No STD concerns. Musculoskeletal: Denies back pain, joint pain Derm: Denies rash, itching Neuro: Denies  paresthesias, frequent falls, frequent headaches Psych: Denies depression, anxiety Endocrine: Denies cold intolerance, heat intolerance, polydipsia Heme: Denies enlarged lymph nodes Allergy: No hayfever  Objective:   BP (!) 146/80 (BP Location: Right Arm, Patient Position: Sitting, Cuff Size: Large)   Pulse 82   Temp 98.1 F (36.7 C) (Temporal)   Ht 6' 1.25" (1.861 m)   Wt 250 lb 12 oz (113.7 kg)   SpO2 97%   BMI 32.86 kg/m  Ideal Body Weight: Weight in (lb) to have BMI = 25: 190.4  Ideal Body Weight: Weight in (lb) to have BMI = 25: 190.4 No exam data present Depression screen [facility] 2/9 06/17/2019 06/15/2018 11/20/2016 11/20/2016 03/27/2015  Decreased Interest 0 0 0 0 0  Down, Depressed, Hopeless 0 0 0 0 0  PHQ - 2 Score 0 0 0 0 0  Altered sleeping 0 0 - - -  Tired,  decreased energy 0 0 - - -  Change in appetite 0 0 - - -  Feeling bad or failure about yourself  0 0 - - -  Trouble concentrating 0 0 - - -  Moving slowly or fidgety/restless 0 0 - - -  Suicidal thoughts 0 0 - - -  PHQ-9 Score 0 0 - - -  Difficult doing work/chores Not difficult at all Not difficult at all - - -     GEN: well developed, well nourished, no acute distress Eyes: conjunctiva and lids normal, PERRLA, EOMI ENT: TM clear, nares clear, oral exam WNL Neck: supple, no lymphadenopathy, no thyromegaly, no JVD Pulm: clear to auscultation and percussion, respiratory effort normal CV: regular rate and rhythm, S1-S2, no murmur, rub or gallop, no bruits, peripheral pulses normal and symmetric, no cyanosis, clubbing, edema or varicosities GI: soft, non-tender; no hepatosplenomegaly, masses; active bowel sounds all quadrants GU: no hernia, testicular mass, penile discharge Lymph: no cervical, axillary or inguinal  adenopathy MSK: gait normal, muscle tone and strength WNL, no joint swelling, effusions, discoloration, crepitus  SKIN: clear, good turgor, color WNL, no rashes, lesions, or ulcerations Neuro: normal mental status, normal strength, sensation, and motion Psych: alert; oriented to person, place and time, normally interactive and not anxious or depressed in appearance. All labs reviewed with patient. Results for orders placed or performed in visit on 06/17/19  Lipid panel  Result Value Ref Range   Cholesterol 191 0 - 200 mg/dL   Triglycerides 119.0 0.0 - 149.0 mg/dL   HDL 43.60 >39.00 mg/dL   VLDL 23.8 0.0 - 40.0 mg/dL   LDL Cholesterol 124 (H) 0 - 99 mg/dL   Total CHOL/HDL Ratio 4    NonHDL 147.44   Hemoglobin A1c  Result Value Ref Range   Hgb A1c MFr Bld 5.4 4.6 - 6.5 %  CBC  Result Value Ref Range   WBC 6.4 4.0 - 10.5 K/uL   RBC 4.80 4.22 - 5.81 Mil/uL   Platelets 270.0 150.0 - 400.0 K/uL   Hemoglobin 16.0 13.0 - 17.0 g/dL   HCT 47.4 39.0 - 52.0 %   MCV 98.7 78.0 -  100.0 fl   MCHC 33.7 30.0 - 36.0 g/dL   RDW 14.0 11.5 - 15.5 %  Hepatic function panel  Result Value Ref Range   Total Bilirubin 1.0 0.2 - 1.2 mg/dL   Bilirubin, Direct 0.2 0.0 - 0.3 mg/dL   Alkaline Phosphatase 75 39 - 117 U/L   AST 20 0 - 37 U/L   ALT 20 0 - 53 U/L   Total Protein 6.6 6.0 - 8.3 g/dL   Albumin 4.4 3.5 - 5.2 g/dL  Basic metabolic panel  Result Value Ref Range   Sodium 140 135 - 145 mEq/L   Potassium 4.3 3.5 - 5.1 mEq/L   Chloride 103 96 - 112 mEq/L   CO2 30 19 - 32 mEq/L   Glucose, Bld 105 (H) 70 - 99 mg/dL   BUN 16 6 - 23 mg/dL   Creatinine, Ser 1.19 0.40 - 1.50 mg/dL   Calcium 9.5 8.4 - 10.5 mg/dL   GFR 59.98 (L) >60.00 mL/min    Assessment and Plan:     ICD-10-CM   1. Healthcare maintenance  Z00.00    Work on weight loss, but otherwise doing well.  Health Maintenance Exam: The patient's preventative maintenance and recommended screening tests for an annual wellness exam were reviewed in full today. Brought up to date unless services declined.  Counselled on the importance of diet, exercise, and its role in overall health and mortality. The patient's FH and SH was reviewed, including their home life, tobacco status, and drug and alcohol status.  Follow-up in 1 year for physical exam or additional follow-up below.  Follow-up: No follow-ups on file. Or follow-up in 1 year if not noted.  No orders of the defined types were placed in this encounter.  There are no discontinued medications. No orders of the defined types were placed in this encounter.   Signed,  Maud Deed. Padraic Marinos, MD   Allergies as of 06/21/2019   No Known Allergies     Medication List       Accurate as of June 21, 2019  9:21 AM. If you have any questions, ask your nurse or doctor.        aspirin EC 81 MG tablet Take 81 mg by mouth every morning.   famotidine 20 MG tablet Commonly known as: PEPCID TAKE 1 TABLET  BY MOUTH  TWICE DAILY   multivitamins ther.  w/minerals Tabs tablet Take 1 tablet by mouth daily.   omeprazole 20 MG capsule Commonly known as: PRILOSEC TAKE 1 CAPSULE BY MOUTH  DAILY

## 2019-06-21 ENCOUNTER — Encounter: Payer: Self-pay | Admitting: Family Medicine

## 2019-06-21 ENCOUNTER — Ambulatory Visit (INDEPENDENT_AMBULATORY_CARE_PROVIDER_SITE_OTHER): Payer: Medicare Other | Admitting: Family Medicine

## 2019-06-21 ENCOUNTER — Other Ambulatory Visit: Payer: Self-pay

## 2019-06-21 VITALS — BP 146/80 | HR 82 | Temp 98.1°F | Ht 73.25 in | Wt 250.8 lb

## 2019-06-21 DIAGNOSIS — Z0000 Encounter for general adult medical examination without abnormal findings: Secondary | ICD-10-CM

## 2019-07-12 ENCOUNTER — Telehealth: Payer: Self-pay | Admitting: Family Medicine

## 2019-07-12 MED ORDER — FAMOTIDINE 20 MG PO TABS: 20.0000 mg | ORAL_TABLET | Freq: Two times a day (BID) | ORAL | 1 refills | Status: DC

## 2019-07-12 NOTE — Telephone Encounter (Signed)
Received fax from [facility] that famotidine 20 mg is out of stock..  They are requesting an alternative or send prescription to local pharmacy.  I spoke with Erik Edwards to see which he would like [place] to do and he wants Rx sent to [facility] in [place].  Prescription sent as requested.

## 2019-07-12 NOTE — Telephone Encounter (Signed)
Pt returned your call.  

## 2019-08-05 DIAGNOSIS — H524 Presbyopia: Secondary | ICD-10-CM | POA: Diagnosis not present

## 2019-09-01 ENCOUNTER — Ambulatory Visit: Payer: Medicare Other | Attending: Internal Medicine

## 2019-09-01 DIAGNOSIS — Z20822 Contact with and (suspected) exposure to covid-19: Secondary | ICD-10-CM

## 2019-09-03 LAB — NOVEL CORONAVIRUS, NAA: SARS-CoV-2, NAA: NOT DETECTED

## 2019-09-29 DIAGNOSIS — M25561 Pain in right knee: Secondary | ICD-10-CM | POA: Diagnosis not present

## 2019-09-29 DIAGNOSIS — M1711 Unilateral primary osteoarthritis, right knee: Secondary | ICD-10-CM | POA: Diagnosis not present

## 2019-10-12 DIAGNOSIS — L570 Actinic keratosis: Secondary | ICD-10-CM | POA: Diagnosis not present

## 2019-10-12 DIAGNOSIS — L918 Other hypertrophic disorders of the skin: Secondary | ICD-10-CM | POA: Diagnosis not present

## 2019-10-12 DIAGNOSIS — L565 Disseminated superficial actinic porokeratosis (DSAP): Secondary | ICD-10-CM | POA: Diagnosis not present

## 2019-10-12 DIAGNOSIS — L821 Other seborrheic keratosis: Secondary | ICD-10-CM | POA: Diagnosis not present

## 2019-10-12 DIAGNOSIS — Z85828 Personal history of other malignant neoplasm of skin: Secondary | ICD-10-CM | POA: Diagnosis not present

## 2019-11-08 DIAGNOSIS — M1711 Unilateral primary osteoarthritis, right knee: Secondary | ICD-10-CM | POA: Diagnosis not present

## 2019-11-15 DIAGNOSIS — M1711 Unilateral primary osteoarthritis, right knee: Secondary | ICD-10-CM | POA: Diagnosis not present

## 2019-11-22 DIAGNOSIS — M1711 Unilateral primary osteoarthritis, right knee: Secondary | ICD-10-CM | POA: Diagnosis not present

## 2019-11-23 DIAGNOSIS — C44629 Squamous cell carcinoma of skin of left upper limb, including shoulder: Secondary | ICD-10-CM | POA: Diagnosis not present

## 2020-05-25 DIAGNOSIS — D225 Melanocytic nevi of trunk: Secondary | ICD-10-CM | POA: Diagnosis not present

## 2020-05-25 DIAGNOSIS — L821 Other seborrheic keratosis: Secondary | ICD-10-CM | POA: Diagnosis not present

## 2020-05-25 DIAGNOSIS — L565 Disseminated superficial actinic porokeratosis (DSAP): Secondary | ICD-10-CM | POA: Diagnosis not present

## 2020-05-25 DIAGNOSIS — D1801 Hemangioma of skin and subcutaneous tissue: Secondary | ICD-10-CM | POA: Diagnosis not present

## 2020-05-25 DIAGNOSIS — L570 Actinic keratosis: Secondary | ICD-10-CM | POA: Diagnosis not present

## 2020-05-25 DIAGNOSIS — Z85828 Personal history of other malignant neoplasm of skin: Secondary | ICD-10-CM | POA: Diagnosis not present

## 2020-06-12 ENCOUNTER — Other Ambulatory Visit: Payer: Self-pay | Admitting: Family Medicine

## 2020-06-19 ENCOUNTER — Telehealth: Payer: Self-pay | Admitting: Family Medicine

## 2020-06-19 NOTE — Telephone Encounter (Signed)
Immunization record updated.

## 2020-06-19 NOTE — Telephone Encounter (Signed)
Pt called to let you know he had his covid vaccines   1st    10/19/19  2nd  11/09/19  pysfer

## 2020-06-22 ENCOUNTER — Ambulatory Visit (INDEPENDENT_AMBULATORY_CARE_PROVIDER_SITE_OTHER): Payer: Medicare Other

## 2020-06-22 DIAGNOSIS — Z23 Encounter for immunization: Secondary | ICD-10-CM | POA: Diagnosis not present

## 2020-06-27 ENCOUNTER — Telehealth: Payer: Self-pay | Admitting: Family Medicine

## 2020-06-27 NOTE — Telephone Encounter (Signed)
Please schedule MWV with nurse and CPE with Dr. Copland.  °

## 2020-06-30 NOTE — Telephone Encounter (Signed)
Left message asking pt to call office  °

## 2020-06-30 NOTE — Telephone Encounter (Signed)
Labs/medicare wellness 1/7 Cpx 1/12

## 2020-09-01 ENCOUNTER — Ambulatory Visit (INDEPENDENT_AMBULATORY_CARE_PROVIDER_SITE_OTHER): Payer: Medicare Other

## 2020-09-01 ENCOUNTER — Other Ambulatory Visit (INDEPENDENT_AMBULATORY_CARE_PROVIDER_SITE_OTHER): Payer: Medicare Other

## 2020-09-01 ENCOUNTER — Other Ambulatory Visit: Payer: Self-pay

## 2020-09-01 DIAGNOSIS — R5383 Other fatigue: Secondary | ICD-10-CM

## 2020-09-01 DIAGNOSIS — Z0000 Encounter for general adult medical examination without abnormal findings: Secondary | ICD-10-CM | POA: Diagnosis not present

## 2020-09-01 DIAGNOSIS — E785 Hyperlipidemia, unspecified: Secondary | ICD-10-CM | POA: Diagnosis not present

## 2020-09-01 DIAGNOSIS — Z125 Encounter for screening for malignant neoplasm of prostate: Secondary | ICD-10-CM | POA: Diagnosis not present

## 2020-09-01 LAB — BASIC METABOLIC PANEL
BUN: 14 mg/dL (ref 6–23)
CO2: 30 mEq/L (ref 19–32)
Calcium: 9.6 mg/dL (ref 8.4–10.5)
Chloride: 101 mEq/L (ref 96–112)
Creatinine, Ser: 1.32 mg/dL (ref 0.40–1.50)
GFR: 53.37 mL/min — ABNORMAL LOW (ref >60.00)
Glucose, Bld: 104 mg/dL — ABNORMAL HIGH (ref 70–99)
Potassium: 4.7 mEq/L (ref 3.5–5.1)
Sodium: 137 mEq/L (ref 135–145)

## 2020-09-01 LAB — LIPID PANEL
Cholesterol: 209 mg/dL — ABNORMAL HIGH (ref 0–200)
HDL: 44.1 mg/dL (ref >39.00)
LDL Cholesterol: 134 mg/dL — ABNORMAL HIGH (ref 0–99)
NonHDL: 164.41
Total CHOL/HDL Ratio: 5
Triglycerides: 154 mg/dL — ABNORMAL HIGH (ref 0.0–149.0)
VLDL: 30.8 mg/dL (ref 0.0–40.0)

## 2020-09-01 LAB — CBC WITH DIFFERENTIAL/PLATELET
Basophils Absolute: 0.1 10*3/uL (ref 0.0–0.1)
Basophils Relative: 0.8 % (ref 0.0–3.0)
Eosinophils Absolute: 0.2 10*3/uL (ref 0.0–0.7)
Eosinophils Relative: 1.9 % (ref 0.0–5.0)
HCT: 49.3 % (ref 39.0–52.0)
Hemoglobin: 16.9 g/dL (ref 13.0–17.0)
Lymphocytes Relative: 13 % (ref 12.0–46.0)
Lymphs Abs: 1.1 10*3/uL (ref 0.7–4.0)
MCHC: 34.4 g/dL (ref 30.0–36.0)
MCV: 97.4 fl (ref 78.0–100.0)
Monocytes Absolute: 1.7 10*3/uL — ABNORMAL HIGH (ref 0.1–1.0)
Monocytes Relative: 20.1 % — ABNORMAL HIGH (ref 3.0–12.0)
Neutro Abs: 5.3 10*3/uL (ref 1.4–7.7)
Neutrophils Relative %: 64.2 % (ref 43.0–77.0)
Platelets: 249 10*3/uL (ref 150.0–400.0)
RBC: 5.06 Mil/uL (ref 4.22–5.81)
RDW: 13.9 % (ref 11.5–15.5)
WBC: 8.3 10*3/uL (ref 4.0–10.5)

## 2020-09-01 LAB — HEPATIC FUNCTION PANEL
ALT: 25 U/L (ref 0–53)
AST: 21 U/L (ref 0–37)
Albumin: 4.9 g/dL (ref 3.5–5.2)
Alkaline Phosphatase: 80 U/L (ref 39–117)
Bilirubin, Direct: 0.3 mg/dL (ref 0.0–0.3)
Total Bilirubin: 1.7 mg/dL — ABNORMAL HIGH (ref 0.2–1.2)
Total Protein: 6.9 g/dL (ref 6.0–8.3)

## 2020-09-01 LAB — PSA, MEDICARE: PSA: 1.66 ng/ml (ref 0.10–4.00)

## 2020-09-01 NOTE — Patient Instructions (Signed)
Erik Edwards , Thank you for taking time to come for your Medicare Wellness Visit. I appreciate your ongoing commitment to your health goals. Please review the following plan we discussed and let me know if I can assist you in the future.   Screening recommendations/referrals: Colonoscopy: declined at this time Recommended yearly ophthalmology/optometry visit for glaucoma screening and checkup Recommended yearly dental visit for hygiene and checkup  Vaccinations: Influenza vaccine: Up to date, completed 06/22/2020, due 03/2021 Pneumococcal vaccine: Completed series Tdap vaccine: Up to date, completed 07/01/2016, due 06/2026 Shingles vaccine: due, check with your insurance regarding coverage if interested    Covid-19: Completed series  Advanced directives: Please bring a copy of your POA (Power of [place]) and/or Living Will to your next appointment.   Conditions/risks identified: hypercholesterolemia   Next appointment: Follow up in one year for your annual wellness visit.   Preventive Care 63 Years and Older, Male Preventive care refers to lifestyle choices and visits with your health care provider that can promote health and wellness. What does preventive care include?  A yearly physical exam. This is also called an annual well check.  Dental exams once or twice a year.  Routine eye exams. Ask your health care provider how often you should have your eyes checked.  Personal lifestyle choices, including:  Daily care of your teeth and gums.  Regular physical activity.  Eating a healthy diet.  Avoiding tobacco and drug use.  Limiting alcohol use.  Practicing safe sex.  Taking low doses of aspirin every day.  Taking vitamin and mineral supplements as recommended by your health care provider. What happens during an annual well check? The services and screenings done by your health care provider during your annual well check will depend on your age, overall health, lifestyle  risk factors, and family history of disease. Counseling  Your health care provider may ask you questions about your:  Alcohol use.  Tobacco use.  Drug use.  Emotional well-being.  Home and relationship well-being.  Sexual activity.  Eating habits.  History of falls.  Memory and ability to understand (cognition).  Work and work Statistician. Screening  You may have the following tests or measurements:  Height, weight, and BMI.  Blood pressure.  Lipid and cholesterol levels. These may be checked every 5 years, or more frequently if you are over 76 years old.  Skin check.  Lung cancer screening. You may have this screening every year starting at age 74 if you have a 30-pack-year history of smoking and currently smoke or have quit within the past 15 years.  Fecal occult blood test (FOBT) of the stool. You may have this test every year starting at age 60.  Flexible sigmoidoscopy or colonoscopy. You may have a sigmoidoscopy every 5 years or a colonoscopy every 10 years starting at age 84.  Prostate cancer screening. Recommendations will vary depending on your family history and other risks.  Hepatitis C blood test.  Hepatitis B blood test.  Sexually transmitted disease (STD) testing.  Diabetes screening. This is done by checking your blood sugar (glucose) after you have not eaten for a while (fasting). You may have this done every 1-3 years.  Abdominal aortic aneurysm (AAA) screening. You may need this if you are a current or former smoker.  Osteoporosis. You may be screened starting at age 59 if you are at high risk. Talk with your health care provider about your test results, treatment options, and if necessary, the need for more tests.  Vaccines  Your health care provider may recommend certain vaccines, such as:  Influenza vaccine. This is recommended every year.  Tetanus, diphtheria, and acellular pertussis (Tdap, Td) vaccine. You may need a Td booster every 10  years.  Zoster vaccine. You may need this after age 11.  Pneumococcal 13-valent conjugate (PCV13) vaccine. One dose is recommended after age 54.  Pneumococcal polysaccharide (PPSV23) vaccine. One dose is recommended after age 33. Talk to your health care provider about which screenings and vaccines you need and how often you need them. This information is not intended to replace advice given to you by your health care provider. Make sure you discuss any questions you have with your health care provider. Document Released: 09/08/2015 Document Revised: 05/01/2016 Document Reviewed: 06/13/2015 Elsevier Interactive Patient Education  2017 [employer] Prevention in the Home Falls can cause injuries. They can happen to people of all ages. There are many things you can do to make your home safe and to help prevent falls. What can I do on the outside of my home?  Regularly fix the edges of walkways and driveways and fix any cracks.  Remove anything that might make you trip as you walk through a door, such as a raised step or threshold.  Trim any bushes or trees on the path to your home.  Use bright outdoor lighting.  Clear any walking paths of anything that might make someone trip, such as rocks or tools.  Regularly check to see if handrails are loose or broken. Make sure that both sides of any steps have handrails.  Any raised decks and porches should have guardrails on the edges.  Have any leaves, snow, or ice cleared regularly.  Use sand or salt on walking paths during winter.  Clean up any spills in your garage right away. This includes oil or grease spills. What can I do in the bathroom?  Use night lights.  Install grab bars by the toilet and in the tub and shower. Do not use towel bars as grab bars.  Use non-skid mats or decals in the tub or shower.  If you need to sit down in the shower, use a plastic, non-slip stool.  Keep the floor dry. Clean up any water that  spills on the floor as soon as it happens.  Remove soap buildup in the tub or shower regularly.  Attach bath mats securely with double-sided non-slip rug tape.  Do not have throw rugs and other things on the floor that can make you trip. What can I do in the bedroom?  Use night lights.  Make sure that you have a light by your bed that is easy to reach.  Do not use any sheets or blankets that are too big for your bed. They should not hang down onto the floor.  Have a firm chair that has side arms. You can use this for support while you get dressed.  Do not have throw rugs and other things on the floor that can make you trip. What can I do in the kitchen?  Clean up any spills right away.  Avoid walking on wet floors.  Keep items that you use a lot in easy-to-reach places.  If you need to reach something above you, use a strong step stool that has a grab bar.  Keep electrical cords out of the way.  Do not use floor polish or wax that makes floors slippery. If you must use wax, use non-skid floor wax.  Do not have throw rugs and other things on the floor that can make you trip. What can I do with my stairs?  Do not leave any items on the stairs.  Make sure that there are handrails on both sides of the stairs and use them. Fix handrails that are broken or loose. Make sure that handrails are as long as the stairways.  Check any carpeting to make sure that it is firmly attached to the stairs. Fix any carpet that is loose or worn.  Avoid having throw rugs at the top or bottom of the stairs. If you do have throw rugs, attach them to the floor with carpet tape.  Make sure that you have a light switch at the top of the stairs and the bottom of the stairs. If you do not have them, ask someone to add them for you. What else can I do to help prevent falls?  Wear shoes that:  Do not have high heels.  Have rubber bottoms.  Are comfortable and fit you well.  Are closed at the  toe. Do not wear sandals.  If you use a stepladder:  Make sure that it is fully opened. Do not climb a closed stepladder.  Make sure that both sides of the stepladder are locked into place.  Ask someone to hold it for you, if possible.  Clearly mark and make sure that you can see:  Any grab bars or handrails.  First and last steps.  Where the edge of each step is.  Use tools that help you move around (mobility aids) if they are needed. These include:  Canes.  Walkers.  Scooters.  Crutches.  Turn on the lights when you go into a dark area. Replace any light bulbs as soon as they burn out.  Set up your furniture so you have a clear path. Avoid moving your furniture around.  If any of your floors are uneven, fix them.  If there are any pets around you, be aware of where they are.  Review your medicines with your doctor. Some medicines can make you feel dizzy. This can increase your chance of falling. Ask your doctor what other things that you can do to help prevent falls. This information is not intended to replace advice given to you by your health care provider. Make sure you discuss any questions you have with your health care provider. Document Released: 06/08/2009 Document Revised: 01/18/2016 Document Reviewed: 09/16/2014 Elsevier Interactive Patient Education  2017 [employer].

## 2020-09-01 NOTE — Progress Notes (Signed)
PCP notes:  Health Maintenance: Colonoscopy- declined    Abnormal Screenings: none   Patient concerns: none   Nurse concerns: none   Next PCP appt.: 09/07/2019 @ 9:20 am

## 2020-09-01 NOTE — Progress Notes (Addendum)
Subjective:   Erik Edwards is a 75 y.o. male who presents for Medicare Annual/Subsequent preventive examination.  Review of Systems: N/A      I connected with the patient today by telephone and verified that I am speaking with the correct person using two identifiers. Location patient: home Location nurse: work Persons participating in the telephone visit: patient, nurse.   I discussed the limitations, risks, security and privacy concerns of performing an evaluation and management service by telephone and the availability of in person appointments. I also discussed with the patient that there may be a patient responsible charge related to this service. The patient expressed understanding and verbally consented to this telephonic visit.        Cardiac Risk Factors include: advanced age (>82men, >1 women);male gender;Other (see comment), Risk factor comments: hypercholesterolemia     Objective:    Today's Vitals   There is no height or weight on file to calculate BMI.  Advanced Directives 09/01/2020 06/17/2019 06/15/2018 01/02/2017 11/20/2016 11/05/2016 06/30/2016  Does Patient Have a Medical Advance Directive? Yes Yes No No Yes No No  Type of Paramedic of [place];Living will [place];Living will - - [place];Living will - -  Copy of [place] in Chart? No - copy requested No - copy requested - - No - copy requested - -  Would patient like information on creating a medical advance directive? - - No - Patient declined No - Patient declined - - No - patient declined information    Current Medications (verified) Outpatient Encounter Medications as of 09/01/2020  Medication Sig  . aspirin EC 81 MG tablet Take 81 mg by mouth every morning.  . famotidine (PEPCID) 20 MG tablet TAKE 1 TABLET BY MOUTH  TWICE DAILY  . Multiple Vitamins-Minerals (MULTIVITAMINS THER. W/MINERALS) TABS Take 1 tablet by mouth  daily.  Marland Kitchen omeprazole (PRILOSEC) 20 MG capsule TAKE 1 CAPSULE BY MOUTH  DAILY   No facility-administered encounter medications on file as of 09/01/2020.    Allergies (verified) Patient has no known allergies.   History: Past Medical History:  Diagnosis Date  . Diverticulosis   . GERD (gastroesophageal reflux disease) 12/02/2013  . History of chicken pox   . Inguinal hernia 01/03/2016   bilateral -repair  . Syncope 07/01/2016  . UTI (urinary tract infection)    Past Surgical History:  Procedure Laterality Date  . INGUINAL HERNIA REPAIR Bilateral 01/02/2017   Procedure: LAPAROSCOPIC EXPLORATION AND REPAIR OF BILATERAL INGUINAL HERNIA;  Surgeon: Michael Boston, MD;  Location: [facility];  Service: General;  Laterality: Bilateral;  . INSERTION OF MESH Bilateral 01/02/2017   Procedure: INSERTION OF MESH;  Surgeon: Michael Boston, MD;  Location: [facility];  Service: General;  Laterality: Bilateral;   Family History  Problem Relation Age of Onset  . Lymphoma Father    Social History   Socioeconomic History  . Marital status: Married    Spouse name: Not on file  . Number of children: 2  . Years of education: Not on file  . Highest education level: Not on file  Occupational History  . Occupation: retired  Tobacco Use  . Smoking status: Former Research scientist (life sciences)  . Smokeless tobacco: Never Used  Vaping Use  . Vaping Use: Never used  Substance and Sexual Activity  . Alcohol use: No  . Drug use: No  . Sexual activity: Not on file  Other Topics Concern  .  Not on file  Social History Narrative  . Not on file   Social Determinants of Health   Financial Resource Strain: Low Risk   . Difficulty of Paying Living Expenses: Not hard at all  Food Insecurity: No Food Insecurity  . Worried About Charity fundraiser in the Last Year: Never true  . Ran Out of Food in the Last Year: Never true  Transportation Needs: No Transportation Needs  . Lack of Transportation  (Medical): No  . Lack of Transportation (Non-Medical): No  Physical Activity: Insufficiently Active  . Days of Exercise per Week: 3 days  . Minutes of Exercise per Session: 30 min  Stress: No Stress Concern Present  . Feeling of Stress : Not at all  Social Connections: Not on file    Tobacco Counseling Counseling given: Not Answered   Clinical Intake:  Pre-visit preparation completed: Yes  Pain : No/denies pain     Nutritional Risks: None Diabetes: No  How often do you need to have someone help you when you read instructions, pamphlets, or other written materials from your doctor or pharmacy?: 1 - Never What is the last grade level you completed in school?: tech school  Diabetic: No Nutrition Risk Assessment:  Has the patient had any N/V/D within the last 2 months?  No  Does the patient have any non-healing wounds?  No  Has the patient had any unintentional weight loss or weight gain?  No   Diabetes:  Is the patient diabetic?  No  If diabetic, was a CBG obtained today?  N/A Did the patient bring in their glucometer from home?  N/A How often do you monitor your CBG's? N/A.   Financial Strains and Diabetes Management:  Are you having any financial strains with the device, your supplies or your medication? N/A.  Does the patient want to be seen by Chronic Care Management for management of their diabetes?  N/A Would the patient like to be referred to a Nutritionist or for Diabetic Management?  N/A    Interpreter Needed?: No  Information entered by :: CJohnson, LPN   Activities of Daily Living In your present state of health, do you have any difficulty performing the following activities: 09/01/2020  Hearing? N  Vision? N  Difficulty concentrating or making decisions? N  Walking or climbing stairs? N  Dressing or bathing? N  Doing errands, shopping? N  Preparing Food and eating ? N  Using the Toilet? N  In the past six months, have you accidently leaked  urine? Y  Do you have problems with loss of bowel control? N  Managing your Medications? N  Managing your Finances? N  Housekeeping or managing your Housekeeping? N  Some recent data might be hidden    Patient Care Team: Owens Loffler, MD as PCP - Huston Foley, MD as Consulting Physician (General Surgery) Sherren Mocha, MD as Consulting Physician (Cardiology)  Indicate any recent Medical Services you may have received from other than [facility] providers in the past year (date may be approximate).     Assessment:   This is a routine wellness examination for [place].  Hearing/Vision screen  Hearing Screening   125Hz  250Hz  500Hz  1000Hz  2000Hz  3000Hz  4000Hz  6000Hz  8000Hz   Right ear:           Left ear:           Vision Screening Comments: Patient gets annual eye exams.   Dietary issues and exercise activities discussed: Current Exercise Habits: Home exercise  routine, Type of exercise: walking, Time (Minutes): 30, Frequency (Times/Week): 3, Weekly Exercise (Minutes/Week): 90, Intensity: Moderate, Exercise limited by: None identified  Goals    . Increase physical activity     Starting 06/15/2018, I will walk for 30 min daily as weather permits.     . Patient Stated     06/17/2019, I will increase my exercise by walking more frequently and for longer.     . Patient Stated     09/01/2020, I will continue to walk 3 days a week for 30 minutes.       Depression Screen PHQ 2/9 Scores 09/01/2020 06/17/2019 06/15/2018 11/20/2016 11/20/2016 03/27/2015 12/01/2013  PHQ - 2 Score 0 0 0 0 0 0 0  PHQ- 9 Score 0 0 0 - - - -    Fall Risk Fall Risk  09/01/2020 06/17/2019 06/15/2018 11/20/2016 03/27/2015  Falls in the past year? 0 0 No Yes No  Comment - - - Nov 2017; pt passed out and fell secondary to UTI -  Number falls in past yr: 0 0 - 1 -  Injury with Fall? 0 0 - Yes -  Comment - - - bruise to nose; no head trauma -  Risk for fall due to : No Fall Risks Medication side effect - - -  Follow  up Falls evaluation completed;Falls prevention discussed Falls evaluation completed;Falls prevention discussed - - -    FALL RISK PREVENTION PERTAINING TO THE HOME:  Any stairs in or around the home? Yes  If so, are there any without handrails? No  Home free of loose throw rugs in walkways, pet beds, electrical cords, etc? Yes  Adequate lighting in your home to reduce risk of falls? Yes   ASSISTIVE DEVICES UTILIZED TO PREVENT FALLS:  Life alert? No  Use of a cane, walker or w/c? No  Grab bars in the bathroom? No  Shower chair or bench in shower? No  Elevated toilet seat or a handicapped toilet? No   TIMED UP AND GO:  Was the test performed? N/A telephone visit.    Cognitive Function: MMSE - Mini Mental State Exam 09/01/2020 06/17/2019 06/15/2018 11/20/2016 11/20/2016  Orientation to time 5 5 5 5 5   Orientation to Place 5 5 5 5 5   Registration 3 3 3 3 3   Attention/ Calculation 5 5 0 0 0  Recall 3 3 3 3 3   Language- name 2 objects - - 0 0 0  Language- repeat 1 1 1 1 1   Language- follow 3 step command - - 3 3 3   Language- read & follow direction - - 0 0 0  Write a sentence - - 0 0 0  Copy design - - 0 0 0  Total score - - 20 20 20   Mini Cog  Mini-Cog screen was completed. Maximum score is 22. A value of 0 denotes this part of the MMSE was not completed or the patient failed this part of the Mini-Cog screening.       Immunizations Immunization History  Administered Date(s) Administered  . Fluad Quad(high Dose 65+) 06/22/2020  . Influenza, High Dose Seasonal PF 05/28/2019  . Influenza, Seasonal, Injecte, Preservative Fre 07/27/2012  . Influenza,inj,Quad PF,6+ Mos 06/17/2013, 06/14/2014, 06/27/2015, 07/08/2016, 06/20/2017, 06/15/2018  . PFIZER SARS-COV-2 Vaccination 10/19/2019, 11/09/2019, 07/07/2020  . Pneumococcal Conjugate-13 12/01/2013  . Pneumococcal Polysaccharide-23 07/27/2012  . Tdap 07/01/2016  . Zoster 07/27/2012    TDAP status: Up to date  Flu Vaccine  status: Up to date  Pneumococcal vaccine status: Up to date  Covid-19 vaccine status: Completed vaccines  Qualifies for Shingles Vaccine? Yes   Zostavax completed Yes   Shingrix Completed?: No.    Education has been provided regarding the importance of this vaccine. Patient has been advised to call insurance company to determine out of pocket expense if they have not yet received this vaccine. Advised may also receive vaccine at local pharmacy or Health Dept. Verbalized acceptance and understanding.  Screening Tests Health Maintenance  Topic Date Due  . COLONOSCOPY (Pts 45-35yrs Insurance coverage will need to be confirmed)  09/01/2021 (Originally 06/11/2020)  . TETANUS/TDAP  07/01/2026  . INFLUENZA VACCINE  Completed  . COVID-19 Vaccine  Completed  . Hepatitis C Screening  Completed  . PNA vac Low Risk Adult  Completed    Health Maintenance  There are no preventive care reminders to display for this patient.   Colorectal cancer screening: declined at this time  Lung Cancer Screening: (Low Dose CT Chest recommended if Age 16-80 years, 30 pack-year currently smoking OR have quit w/in 15 years.) does not qualify.    Additional Screening:  Hepatitis C Screening: does qualify; Completed 11/20/2016  Vision Screening: Recommended annual ophthalmology exams for early detection of glaucoma and other disorders of the eye. Is the patient up to date with their annual eye exam?  Yes  Who is the provider or what is the name of the office in which the patient attends annual eye exams? [facility]  If pt is not established with a provider, would they like to be referred to a provider to establish care? No .   Dental Screening: Recommended annual dental exams for proper oral hygiene  Community Resource Referral / Chronic Care Management: CRR required this visit?  No   CCM required this visit?  No      Plan:     I have personally reviewed and noted the following in the  patient's chart:   . Medical and social history . Use of alcohol, tobacco or illicit drugs  . Current medications and supplements . Functional ability and status . Nutritional status . Physical activity . Advanced directives . List of other physicians . Hospitalizations, surgeries, and ER visits in previous 12 months . Vitals . Screenings to include cognitive, depression, and falls . Referrals and appointments  In addition, I have reviewed and discussed with patient certain preventive protocols, quality metrics, and best practice recommendations. A written personalized care plan for preventive services as well as general preventive health recommendations were provided to patient.   Due to this being a telephonic visit, the after visit summary with patients personalized plan was offered to patient via office or my-chart. Patient preferred to pick up at office at next visit or via mychart.   Andrez Grime, LPN   10/01/3333

## 2020-09-04 ENCOUNTER — Other Ambulatory Visit: Payer: Self-pay | Admitting: Family Medicine

## 2020-09-06 ENCOUNTER — Other Ambulatory Visit: Payer: Self-pay

## 2020-09-06 ENCOUNTER — Other Ambulatory Visit: Payer: Medicare Other

## 2020-09-06 ENCOUNTER — Other Ambulatory Visit: Payer: Self-pay | Admitting: Family Medicine

## 2020-09-06 ENCOUNTER — Encounter: Payer: Self-pay | Admitting: Family Medicine

## 2020-09-06 ENCOUNTER — Telehealth (INDEPENDENT_AMBULATORY_CARE_PROVIDER_SITE_OTHER): Payer: Medicare Other | Admitting: Family Medicine

## 2020-09-06 ENCOUNTER — Encounter: Payer: Medicare Other | Admitting: Family Medicine

## 2020-09-06 VITALS — Temp 96.8°F | Ht 74.0 in | Wt 248.0 lb

## 2020-09-06 DIAGNOSIS — Z20822 Contact with and (suspected) exposure to covid-19: Secondary | ICD-10-CM

## 2020-09-06 DIAGNOSIS — R0981 Nasal congestion: Secondary | ICD-10-CM | POA: Diagnosis not present

## 2020-09-06 NOTE — Progress Notes (Signed)
covid test

## 2020-09-06 NOTE — Progress Notes (Signed)
      Erik Sagan T. Jazlynn Nemetz, MD Primary Care and [place] at [facility] [place], [address] Phone: [phone]  FAX: [place] - 75 y.o. male  MRN [id number]  Date of Birth: [birth year]  Visit Date: 09/06/2020  PCP: Owens Loffler, MD  Referred by: Owens Loffler, MD  Virtual Visit via Video Note:  I connected with  Erik Edwards on 09/06/2020  9:20 AM EST by a video enabled telemedicine application and verified that I am speaking with the correct person using two identifiers.   Location patient: home computer, tablet, or smartphone Location provider: work or home office Consent: Verbal consent directly obtained from [employer]. Persons participating in the virtual visit: patient, provider  I discussed the limitations of evaluation and management by telemedicine and the availability of in person appointments. The patient expressed understanding and agreed to proceed.  Interactive audio and video telecommunications were attempted between this provider and patient, however failed, due to patient having technical difficulties OR patient did not have access to video capability.  We continued and completed visit with audio only.    Chief Complaint  Patient presents with  . Nasal Congestion    Started on Saturday.     History of Present Illness:  Sat. Started to have a bad runny nose.  No fever.  Now he has a cough.  Nose has some thicker.  This all started last Wednesday, and he does not really feel all that bad.  He has been taking Tylenol, and that is it.  Does not really feel bad.  No ST, no earache.   No GI symptoms.  Has been quarantine.  Wife has some runny nose that started today.  Immunization History  Administered Date(s) Administered  . Fluad Quad(high Dose 65+) 06/22/2020  . Influenza, High Dose Seasonal PF 05/28/2019  . Influenza, Seasonal, Injecte, Preservative Fre 07/27/2012   . Influenza,inj,Quad PF,6+ Mos 06/17/2013, 06/14/2014, 06/27/2015, 07/08/2016, 06/20/2017, 06/15/2018  . PFIZER SARS-COV-2 Vaccination 10/19/2019, 11/09/2019, 07/07/2020  . Pneumococcal Conjugate-13 12/01/2013  . Pneumococcal Polysaccharide-23 07/27/2012  . Tdap 07/01/2016  . Zoster 07/27/2012     Review of Systems as above: See pertinent positives and pertinent negatives per HPI No acute distress verbally   Observations/Objective/Exam:  An attempt was made to discern vital signs over the phone and per patient if applicable and possible.   General:    Alert, Oriented, appears well and in no acute distress  Pulmonary:     On inspection no signs of respiratory distress.  Psych / Neurological:     Pleasant and cooperative.  Assessment and Plan:    ICD-10-CM   1. Nasal congestion  R09.81   2. Suspected COVID-19 virus infection  Z20.822    Nasal congestion with basic cold-like symptoms, assume COVID-19 and check today.  Otherwise do basic viral home care.  I discussed the assessment and treatment plan with the patient. The patient was provided an opportunity to ask questions and all were answered. The patient agreed with the plan and demonstrated an understanding of the instructions.   The patient was advised to call back or seek an in-person evaluation if the symptoms worsen or if the condition fails to improve as anticipated.  Signed,  Maud Deed. Jeris Easterly, MD

## 2020-09-06 NOTE — Progress Notes (Signed)
Lab appointment scheduled today at 3:35 pm for Covid testing.  Back Door instructions provided.

## 2020-09-10 LAB — NOVEL CORONAVIRUS, NAA: SARS-CoV-2, NAA: NOT DETECTED

## 2020-09-20 ENCOUNTER — Encounter: Payer: Medicare Other | Admitting: Family Medicine

## 2020-10-04 ENCOUNTER — Encounter: Payer: Medicare Other | Admitting: Family Medicine

## 2020-10-19 ENCOUNTER — Ambulatory Visit (INDEPENDENT_AMBULATORY_CARE_PROVIDER_SITE_OTHER): Payer: Medicare Other | Admitting: Family Medicine

## 2020-10-19 ENCOUNTER — Encounter: Payer: Self-pay | Admitting: Family Medicine

## 2020-10-19 ENCOUNTER — Other Ambulatory Visit: Payer: Self-pay

## 2020-10-19 VITALS — BP 150/88 | HR 82 | Temp 98.2°F | Ht 73.0 in | Wt 257.8 lb

## 2020-10-19 DIAGNOSIS — Z0000 Encounter for general adult medical examination without abnormal findings: Secondary | ICD-10-CM

## 2020-10-19 NOTE — Patient Instructions (Signed)
Check with your insurance to see if they will cover the shingles shot.  The newer SHINGRIX shot is much better than the older shot. The SHINGRIX shot requires 2 shots given 6 months apart.  Almost always, this shot is not covered in our office by your insurance. It costs 500 dollars, but essentially all insurances cover it at your pharmacy.  I would call your insurance number on your card to confirm this. 

## 2020-10-19 NOTE — Progress Notes (Signed)
Erik Edwards T. Erik Magno, MD, [place] at [facility] [place], [address]  Phone: [phone]  FAX: [place] - 75 y.o. male  MRN [id number]  Date of Birth: [birth year]  Date: 10/19/2020  PCP: Owens Loffler, MD  Referral: Owens Loffler, MD  Chief Complaint  Patient presents with  . Annual Exam    Part 2    This visit occurred during the SARS-CoV-2 public health emergency.  Safety protocols were in place, including screening questions prior to the visit, additional usage of staff PPE, and extensive cleaning of exam room while observing appropriate contact time as indicated for disinfecting solutions.   Patient Care Team: Owens Loffler, MD as PCP - Huston Foley, MD as Consulting Physician (General Surgery) Sherren Mocha, MD as Consulting Physician (Cardiology) Subjective:   Erik Edwards is a 75 y.o. pleasant patient who presents with the following:  Preventative Health Maintenance Visit:  Health Maintenance Summary Reviewed and updated, unless pt declines services.  Tobacco History Reviewed. Alcohol: No concerns, no excessive use Exercise Habits: Some activity, rec at least 30 mins 5 times a week - stopped at the y [facility] in his neighborhood STD concerns: no risk or activity to increase risk Drug Use: None  Shingrix  He has gained about 10 pounds in the last few months  Health Maintenance  Topic Date Due  . COLONOSCOPY (Pts 45-40yr Insurance coverage will need to be confirmed)  09/01/2021 (Originally 06/11/2020)  . TETANUS/TDAP  07/01/2026  . INFLUENZA VACCINE  Completed  . COVID-19 Vaccine  Completed  . Hepatitis C Screening  Completed  . PNA vac Low Risk Adult  Completed   Immunization History  Administered Date(s) Administered  . Fluad Quad(high Dose 65+) 06/22/2020  . Influenza, High Dose Seasonal PF 05/28/2019  . Influenza,  Seasonal, Injecte, Preservative Fre 07/27/2012  . Influenza,inj,Quad PF,6+ Mos 06/17/2013, 06/14/2014, 06/27/2015, 07/08/2016, 06/20/2017, 06/15/2018  . PFIZER(Purple Top)SARS-COV-2 Vaccination 10/19/2019, 11/09/2019, 07/07/2020  . Pneumococcal Conjugate-13 12/01/2013  . Pneumococcal Polysaccharide-23 07/27/2012  . Tdap 07/01/2016  . Zoster 07/27/2012   Patient Active Problem List   Diagnosis Date Noted  . High cholesterol 06/17/2018  . Prediabetes 06/17/2018  . Aortic calcification ([facility] 11/20/2016  . Bilateral inguinal hernias (direct & indirect) s/p laparoscopic repair with mesh 01/02/2017 11/20/2016  . Fatty liver 11/20/2016  . GERD (gastroesophageal reflux disease) 12/02/2013  . Obesity (BMI 30-39.9) 12/02/2013    Past Medical History:  Diagnosis Date  . Diverticulosis   . GERD (gastroesophageal reflux disease) 12/02/2013  . History of chicken pox   . Inguinal hernia 01/03/2016   bilateral -repair  . Syncope 07/01/2016  . UTI (urinary tract infection)     Past Surgical History:  Procedure Laterality Date  . INGUINAL HERNIA REPAIR Bilateral 01/02/2017   Procedure: LAPAROSCOPIC EXPLORATION AND REPAIR OF BILATERAL INGUINAL HERNIA;  Surgeon: GMichael Boston MD;  [facility]  Service: General;  Laterality: Bilateral;  . INSERTION OF MESH Bilateral 01/02/2017   Procedure: INSERTION OF MESH;  Surgeon: GMichael Boston MD;  [facility]  Service: General;  Laterality: Bilateral;    Family History  Problem Relation Age of Onset  . Lymphoma Father     Past Medical History, Surgical History, Social History, Family History, Problem List, Medications, and Allergies have been reviewed and updated if relevant.  Review of Systems: Pertinent positives are listed above.  Otherwise,  a full 14 point review of systems has been done in full and it is negative except where it is noted positive.  Objective:   BP (!) 150/88   Pulse 82   Temp  98.2 F (36.8 C) (Temporal)   Ht '6\' 1"'  (1.854 m)   Wt 257 lb 12 oz (116.9 kg)   SpO2 95%   BMI 34.01 kg/m  Ideal Body Weight: Weight in (lb) to have BMI = 25: 189.1  Ideal Body Weight: Weight in (lb) to have BMI = 25: 189.1 No exam data present Depression screen [facility] 2/9 09/01/2020 06/17/2019 06/15/2018 11/20/2016 11/20/2016  Decreased Interest 0 0 0 0 0  Down, Depressed, Hopeless 0 0 0 0 0  PHQ - 2 Score 0 0 0 0 0  Altered sleeping 0 0 0 - -  Tired, decreased energy 0 0 0 - -  Change in appetite 0 0 0 - -  Feeling bad or failure about yourself  0 0 0 - -  Trouble concentrating 0 0 0 - -  Moving slowly or fidgety/restless 0 0 0 - -  Suicidal thoughts 0 0 0 - -  PHQ-9 Score 0 0 0 - -  Difficult doing work/chores Not difficult at all Not difficult at all Not difficult at all - -     GEN: well developed, well nourished, no acute distress Eyes: conjunctiva and lids normal, PERRLA, EOMI ENT: TM clear, nares clear, oral exam WNL Neck: supple, no lymphadenopathy, no thyromegaly, no JVD Pulm: clear to auscultation and percussion, respiratory effort normal CV: regular rate and rhythm, S1-S2, no murmur, rub or gallop, no bruits, peripheral pulses normal and symmetric, no cyanosis, clubbing, edema or varicosities GI: soft, non-tender; no hepatosplenomegaly, masses; active bowel sounds all quadrants GU: deferred Lymph: no cervical, axillary or inguinal adenopathy MSK: gait normal, muscle tone and strength WNL, no joint swelling, effusions, discoloration, crepitus  SKIN: clear, good turgor, color WNL, no rashes, lesions, or ulcerations Neuro: normal mental status, normal strength, sensation, and motion Psych: alert; oriented to person, place and time, normally interactive and not anxious or depressed in appearance.  All labs reviewed with patient. Lab Review:  CBC EXTENDED Latest Ref Rng & Units 09/01/2020 06/17/2019 06/15/2018  WBC 4.0 - 10.5 K/uL 8.3 6.4 6.7  RBC 4.22 - 5.81 Mil/uL 5.06  4.80 5.15  HGB 13.0 - 17.0 g/dL 16.9 16.0 17.4(H)  HCT 39.0 - 52.0 % 49.3 47.4 50.3  PLT 150.0 - 400.0 K/uL 249.0 270.0 277.0  NEUTROABS 1.4 - 7.7 K/uL 5.3 - 4.0  LYMPHSABS 0.7 - 4.0 K/uL 1.1 - 1.5    BMP Latest Ref Rng & Units 09/01/2020 06/17/2019 06/15/2018  Glucose 70 - 99 mg/dL 104(H) 105(H) 108(H)  BUN 6 - 23 mg/dL '14 16 15  ' Creatinine 0.40 - 1.50 mg/dL 1.32 1.19 1.21  Sodium 135 - 145 mEq/L 137 140 140  Potassium 3.5 - 5.1 mEq/L 4.7 4.3 5.1  Chloride 96 - 112 mEq/L 101 103 103  CO2 19 - 32 mEq/L '30 30 30  ' Calcium 8.4 - 10.5 mg/dL 9.6 9.5 9.9    Hepatic Function Latest Ref Rng & Units 09/01/2020 06/17/2019 06/15/2018  Total Protein 6.0 - 8.3 g/dL 6.9 6.6 6.7  Albumin 3.5 - 5.2 g/dL 4.9 4.4 4.6  AST 0 - 37 U/L '21 20 18  ' ALT 0 - 53 U/L '25 20 25  ' Alk Phosphatase 39 - 117 U/L 80 75 77  Total Bilirubin 0.2 - 1.2 mg/dL 1.7(H) 1.0  1.0  Bilirubin, Direct 0.0 - 0.3 mg/dL 0.3 0.2 0.2    Lab Results  Component Value Date   CHOL 209 (H) 09/01/2020   Lab Results  Component Value Date   HDL 44.10 09/01/2020   Lab Results  Component Value Date   LDLCALC 134 (H) 09/01/2020   Lab Results  Component Value Date   TRIG 154.0 (H) 09/01/2020   Lab Results  Component Value Date   CHOLHDL 5 09/01/2020   No results for input(s): PSA in the last 72 hours. Lab Results  Component Value Date   HCVAB NEGATIVE 11/20/2016   No results found for: [facility]   Lab Results  Component Value Date   HGBA1C 5.4 06/17/2019   Lab Results  Component Value Date   LDLCALC 134 (H) 09/01/2020   CREATININE 1.32 09/01/2020    Assessment and Plan:     ICD-10-CM   1. Healthcare maintenance  Z00.00    Keep working on weight as primary goal - exercise more, too.  Patient Instructions  Check with your insurance to see if they will cover the shingles shot.  The newer [facility] shot is much better than the older shot. The [facility] shot requires 2 shots given 6 months apart.  Almost always, this  shot is not covered in our office by your insurance. It costs 500 dollars, but essentially all insurances cover it at your pharmacy.  I would call your insurance number on your card to confirm this.     Health Maintenance Exam: The patient's preventative maintenance and recommended screening tests for an annual wellness exam were reviewed in full today. Brought up to date unless services declined.  Counselled on the importance of diet, exercise, and its role in overall health and mortality. The patient's FH and SH was reviewed, including their home life, tobacco status, and drug and alcohol status.  Follow-up in 1 year for physical exam or additional follow-up below.  Follow-up: No follow-ups on file. Or follow-up in 1 year if not noted.  No orders of the defined types were placed in this encounter.  There are no discontinued medications. No orders of the defined types were placed in this encounter.   Signed,  Maud Deed. Nori Winegar, MD   Allergies as of 10/19/2020   No Known Allergies     Medication List       Accurate as of October 19, 2020 10:50 AM. If you have any questions, ask your nurse or doctor.        aspirin EC 81 MG tablet Take 81 mg by mouth every morning.   famotidine 20 MG tablet Commonly known as: PEPCID TAKE 1 TABLET BY MOUTH  TWICE DAILY   multivitamins ther. w/minerals Tabs tablet Take 1 tablet by mouth daily.   omeprazole 20 MG capsule Commonly known as: PRILOSEC TAKE 1 CAPSULE BY MOUTH  DAILY

## 2021-05-30 DIAGNOSIS — Z85828 Personal history of other malignant neoplasm of skin: Secondary | ICD-10-CM | POA: Diagnosis not present

## 2021-05-30 DIAGNOSIS — D044 Carcinoma in situ of skin of scalp and neck: Secondary | ICD-10-CM | POA: Diagnosis not present

## 2021-05-30 DIAGNOSIS — L570 Actinic keratosis: Secondary | ICD-10-CM | POA: Diagnosis not present

## 2021-05-30 DIAGNOSIS — L821 Other seborrheic keratosis: Secondary | ICD-10-CM | POA: Diagnosis not present

## 2021-05-30 DIAGNOSIS — L565 Disseminated superficial actinic porokeratosis (DSAP): Secondary | ICD-10-CM | POA: Diagnosis not present

## 2021-08-30 NOTE — Progress Notes (Signed)
Subjective:   Erik Edwards is a 76 y.o. male who presents for Medicare Annual/Subsequent preventive examination.  I connected with Gwenyth Allegra today by telephone and verified that I am speaking with the correct person using two identifiers. Location patient: home Location provider: work Persons participating in the virtual visit: patient, Marine scientist.    I discussed the limitations, risks, security and privacy concerns of performing an evaluation and management service by telephone and the availability of in person appointments. I also discussed with the patient that there may be a patient responsible charge related to this service. The patient expressed understanding and verbally consented to this telephonic visit.    Interactive audio and video telecommunications were attempted between this provider and patient, however failed, due to patient having technical difficulties OR patient did not have access to video capability.  We continued and completed visit with audio only.  Some vital signs may be absent or patient reported.   Time Spent with patient on telephone encounter: 20 minutes  Review of Systems     Cardiac Risk Factors include: advanced age (>16men, >50 women)     Objective:    Today's Vitals   09/03/21 1031  Weight: 248 lb (112.5 kg)  Height: 6\' 1"  (1.854 m)   Body mass index is 32.72 kg/m.  Advanced Directives 09/03/2021 09/01/2020 06/17/2019 06/15/2018 01/02/2017 11/20/2016 11/05/2016  Does Patient Have a Medical Advance Directive? No Yes Yes No No Yes No  Type of Advance Directive - [place];Living will [place];Living will - - [place];Living will -  Copy of [place] in Chart? - No - copy requested No - copy requested - - No - copy requested -  Would patient like information on creating a medical advance directive? Yes (MAU/Ambulatory/Procedural Areas - Information given) - - No - Patient  declined No - Patient declined - -    Current Medications (verified) Outpatient Encounter Medications as of 09/03/2021  Medication Sig   aspirin EC 81 MG tablet Take 81 mg by mouth every morning.   famotidine (PEPCID) 20 MG tablet TAKE 1 TABLET BY MOUTH  TWICE DAILY   Multiple Vitamins-Minerals (MULTIVITAMINS THER. W/MINERALS) TABS Take 1 tablet by mouth daily.   omeprazole (PRILOSEC) 20 MG capsule TAKE 1 CAPSULE BY MOUTH  DAILY   No facility-administered encounter medications on file as of 09/03/2021.    Allergies (verified) Patient has no known allergies.   History: Past Medical History:  Diagnosis Date   Diverticulosis    GERD (gastroesophageal reflux disease) 12/02/2013   History of chicken pox    Inguinal hernia 01/03/2016   bilateral -repair   Past Surgical History:  Procedure Laterality Date   INGUINAL HERNIA REPAIR Bilateral 01/02/2017   Procedure: LAPAROSCOPIC EXPLORATION AND REPAIR OF BILATERAL INGUINAL HERNIA;  Surgeon: Michael Boston, MD;  Location: [place];  Service: General;  Laterality: Bilateral;   INSERTION OF MESH Bilateral 01/02/2017   Procedure: INSERTION OF MESH;  Surgeon: Michael Boston, MD;  Location: [place];  Service: General;  Laterality: Bilateral;   Family History  Problem Relation Age of Onset   Lymphoma Father    Social History   Socioeconomic History   Marital status: Married    Spouse name: Not on file   Number of children: 2   Years of education: Not on file   Highest education level: Not on file  Occupational History   Occupation: retired  Tobacco Use  Smoking status: Former   Smokeless tobacco: Never  Scientific laboratory technician Use: Never used  Substance and Sexual Activity   Alcohol use: No   Drug use: No   Sexual activity: Not on file  Other Topics Concern   Not on file  Social History Narrative   Not on file   Social Determinants of Health   Financial Resource Strain: Low Risk    Difficulty of  Paying Living Expenses: Not hard at all  Food Insecurity: No Food Insecurity   Worried About Charity fundraiser in the Last Year: Never true   [place] in the Last Year: Never true  Transportation Needs: No Transportation Needs   Lack of Transportation (Medical): No   Lack of Transportation (Non-Medical): No  Physical Activity: Insufficiently Active   Days of Exercise per Week: 3 days   Minutes of Exercise per Session: 40 min  Stress: No Stress Concern Present   Feeling of Stress : Not at all  Social Connections: Moderately Integrated   Frequency of Communication with Friends and Family: More than three times a week   Frequency of Social Gatherings with Friends and Family: More than three times a week   Attends Religious Services: More than 4 times per year   Active Member of [employer] or Organizations: No   Attends Music therapist: Never   Marital Status: Married    Tobacco Counseling Counseling given: Not Answered   Clinical Intake:  Pre-visit preparation completed: Yes  Pain : No/denies pain     BMI - recorded: 32.72 Nutritional Status: BMI > 30  Obese Nutritional Risks: None  How often do you need to have someone help you when you read instructions, pamphlets, or other written materials from your doctor or pharmacy?: 1 - Never  Diabetic? No  Interpreter Needed?: No  Information entered by :: Orrin Brigham LPN   Activities of Daily Living In your present state of health, do you have any difficulty performing the following activities: 09/03/2021  Hearing? N  Vision? N  Difficulty concentrating or making decisions? N  Walking or climbing stairs? N  Dressing or bathing? N  Doing errands, shopping? N  Preparing Food and eating ? N  Using the Toilet? N  In the past six months, have you accidently leaked urine? N  Do you have problems with loss of bowel control? N  Managing your Medications? N  Managing your Finances? N  Housekeeping or  managing your Housekeeping? N  Some recent data might be hidden    Patient Care Team: Owens Loffler, MD as PCP - Huston Foley, MD as Consulting Physician (General Surgery) Sherren Mocha, MD as Consulting Physician (Cardiology)  Indicate any recent Medical Services you may have received from other than [facility] providers in the past year (date may be approximate).     Assessment:   This is a routine wellness examination for Yaasir.  Hearing/Vision screen Hearing Screening - Comments:: No issues Vision Screening - Comments:: Last exam over a year, has an upcoming appointment. [facility]  Dietary issues and exercise activities discussed: Current Exercise Habits: Home exercise routine, Type of exercise: walking, Time (Minutes): 45, Frequency (Times/Week): 3, Weekly Exercise (Minutes/Week): 135, Intensity: Moderate   Goals Addressed             This Visit's Progress    Patient Stated       Would like to maintain current routine       Depression  Screen PHQ 2/9 Scores 09/03/2021 09/01/2020 06/17/2019 06/15/2018 11/20/2016 11/20/2016 03/27/2015  PHQ - 2 Score 0 0 0 0 0 0 0  PHQ- 9 Score - 0 0 0 - - -    Fall Risk Fall Risk  09/03/2021 09/01/2020 06/17/2019 06/15/2018 11/20/2016  Falls in the past year? 0 0 0 No Yes  Comment - - - - Nov 2017; pt passed out and fell secondary to UTI  Number falls in past yr: 0 0 0 - 1  Injury with Fall? 0 0 0 - Yes  Comment - - - - bruise to nose; no head trauma  Risk for fall due to : - No Fall Risks Medication side effect - -  Follow up Falls prevention discussed Falls evaluation completed;Falls prevention discussed Falls evaluation completed;Falls prevention discussed - -    FALL RISK PREVENTION PERTAINING TO THE HOME:  Any stairs in or around the home? Yes  If so, are there any without handrails? No  Home free of loose throw rugs in walkways, pet beds, electrical cords, etc? Yes  Adequate lighting in your home to reduce risk of  falls? Yes   ASSISTIVE DEVICES UTILIZED TO PREVENT FALLS:  Life alert? No  Use of a cane, walker or w/c? No  Grab bars in the bathroom? No  Shower chair or bench in shower? Yes  Elevated toilet seat or a handicapped toilet? No   TIMED UP AND GO:  Was the test performed? No .     Cognitive Function: Normal cognitive status assessed by  this Nurse Health Advisor. No abnormalities found.   MMSE - Mini Mental State Exam 09/01/2020 06/17/2019 06/15/2018 11/20/2016 11/20/2016  Orientation to time 5 5 5 5 5   Orientation to Place 5 5 5 5 5   Registration 3 3 3 3 3   Attention/ Calculation 5 5 0 0 0  Recall 3 3 3 3 3   Language- name 2 objects - - 0 0 0  Language- repeat 1 1 1 1 1   Language- follow 3 step command - - 3 3 3   Language- read & follow direction - - 0 0 0  Write a sentence - - 0 0 0  Copy design - - 0 0 0  Total score - - 20 20 20         Immunizations Immunization History  Administered Date(s) Administered   Fluad Quad(high Dose 65+) 06/22/2020   Influenza, High Dose Seasonal PF 05/28/2019   Influenza, Seasonal, Injecte, Preservative Fre 07/27/2012   Influenza,inj,Quad PF,6+ Mos 06/17/2013, 06/14/2014, 06/27/2015, 07/08/2016, 06/20/2017, 06/15/2018   PFIZER(Purple Top)SARS-COV-2 Vaccination 10/19/2019, 11/09/2019, 07/07/2020   Pneumococcal Conjugate-13 12/01/2013   Pneumococcal Polysaccharide-23 07/27/2012   Tdap 07/01/2016   Zoster, Live 07/27/2012    TDAP status: Up to date  Flu Vaccine status: Due, Education has been provided regarding the importance of this vaccine. Advised may receive this vaccine at local pharmacy or Health Dept. Aware to provide a copy of the vaccination record if obtained from local pharmacy or Health Dept. Verbalized acceptance and understanding.  Pneumococcal vaccine status: Up to date  Covid-19 vaccine status: Information provided on how to obtain vaccines.   Qualifies for Shingles Vaccine? Yes   Zostavax completed Yes   Shingrix  Completed?: No.    Education has been provided regarding the importance of this vaccine. Patient has been advised to call insurance company to determine out of pocket expense if they have not yet received this vaccine. Advised may also receive vaccine at local pharmacy  or Health Dept. Verbalized acceptance and understanding.  Screening Tests Health Maintenance  Topic Date Due   Zoster Vaccines- Shingrix (1 of 2) Never done   COLONOSCOPY (Pts 45-16yrs Insurance coverage will need to be confirmed)  06/11/2020   COVID-19 Vaccine (4 - Booster for Pfizer series) 09/01/2020   INFLUENZA VACCINE  03/26/2021   TETANUS/TDAP  07/01/2026   Pneumonia Vaccine 26+ Years old  Completed   Hepatitis C Screening  Completed   HPV VACCINES  Aged Out    Health Maintenance  Health Maintenance Due  Topic Date Due   Zoster Vaccines- Shingrix (1 of 2) Never done   COLONOSCOPY (Pts 45-42yrs Insurance coverage will need to be confirmed)  06/11/2020   COVID-19 Vaccine (4 - Booster for [place] series) 09/01/2020   INFLUENZA VACCINE  03/26/2021    Colorectal cancer screening: Type of screening: Colonoscopy. Completed 06/12/15. Repeat every 7 years  Lung Cancer Screening: (Low Dose CT Chest recommended if Age 71-80 years, 30 pack-year currently smoking OR have quit w/in 15years.) does not qualify.     Additional Screening:  Hepatitis C Screening: does qualify; Completed 11/20/16  Vision Screening: Recommended annual ophthalmology exams for early detection of glaucoma and other disorders of the eye. Is the patient up to date with their annual eye exam?  No , patient has an upcoming appointment. Who is the provider or what is the name of the office in which the patient attends annual eye exams? [place] Screening: Recommended annual dental exams for proper oral hygiene  Community Resource Referral / Chronic Care Management: CRR required this visit?  No   CCM required this visit?  No       Plan:     I have personally reviewed and noted the following in the patients chart:   Medical and social history Use of alcohol, tobacco or illicit drugs  Current medications and supplements including opioid prescriptions. Patient is not currently taking opioid prescriptions. Functional ability and status Nutritional status Physical activity Advanced directives List of other physicians Hospitalizations, surgeries, and ER visits in previous 12 months Vitals Screenings to include cognitive, depression, and falls Referrals and appointments  In addition, I have reviewed and discussed with patient certain preventive protocols, quality metrics, and best practice recommendations. A written personalized care plan for preventive services as well as general preventive health recommendations were provided to patient.   Due to this being a telephonic visit, the after visit summary with patients personalized plan was offered to patient via mail or my-chart. Patient would like to access on my-chart.      Loma Messing, LPN   10/27/1959   Nurse Health Advisor  Nurse Notes: none

## 2021-09-03 ENCOUNTER — Ambulatory Visit (INDEPENDENT_AMBULATORY_CARE_PROVIDER_SITE_OTHER): Payer: Medicare Other

## 2021-09-03 VITALS — Ht 73.0 in | Wt 248.0 lb

## 2021-09-03 DIAGNOSIS — Z0000 Encounter for general adult medical examination without abnormal findings: Secondary | ICD-10-CM | POA: Diagnosis not present

## 2021-09-03 NOTE — Patient Instructions (Addendum)
Erik Edwards , Thank you for taking time to complete your Medicare Wellness Visit. I appreciate your ongoing commitment to your health goals. Please review the following plan we discussed and let me know if I can assist you in the future.   Screening recommendations/referrals: Colonoscopy: up to date, completed 06/12/15, due 06/11/22 Recommended yearly ophthalmology/optometry visit for glaucoma screening and checkup Recommended yearly dental visit for hygiene and checkup  Vaccinations: Influenza vaccine: Due-May obtain vaccine at our office or your local pharmacy. Pneumococcal vaccine: up to date Tdap vaccine: up to date , completed 07/11/16, due 07/11/26 Shingles vaccine: Discuss with your local pharmacy if you change your mind  Covid-19: newest booster available at your local pharmacy  Advanced directives: information available at your next visit  Conditions/risks identified: see problem list  Next appointment: Follow up in one year for your annual wellness visit. 09/04/22 @ 10:30am, this will be a telephone visit.   Preventive Care 38 Years and Older, Male Preventive care refers to lifestyle choices and visits with your health care provider that can promote health and wellness. What does preventive care include? A yearly physical exam. This is also called an annual well check. Dental exams once or twice a year. Routine eye exams. Ask your health care provider how often you should have your eyes checked. Personal lifestyle choices, including: Daily care of your teeth and gums. Regular physical activity. Eating a healthy diet. Avoiding tobacco and drug use. Limiting alcohol use. Practicing safe sex. Taking low doses of aspirin every day. Taking vitamin and mineral supplements as recommended by your health care provider. What happens during an annual well check? The services and screenings done by your health care provider during your annual well check will depend on your age,  overall health, lifestyle risk factors, and family history of disease. Counseling  Your health care provider may ask you questions about your: Alcohol use. Tobacco use. Drug use. Emotional well-being. Home and relationship well-being. Sexual activity. Eating habits. History of falls. Memory and ability to understand (cognition). Work and work Statistician. Screening  You may have the following tests or measurements: Height, weight, and BMI. Blood pressure. Lipid and cholesterol levels. These may be checked every 5 years, or more frequently if you are over 29 years old. Skin check. Lung cancer screening. You may have this screening every year starting at age 66 if you have a 30-pack-year history of smoking and currently smoke or have quit within the past 15 years. Fecal occult blood test (FOBT) of the stool. You may have this test every year starting at age 20. Flexible sigmoidoscopy or colonoscopy. You may have a sigmoidoscopy every 5 years or a colonoscopy every 10 years starting at age 53. Prostate cancer screening. Recommendations will vary depending on your family history and other risks. Hepatitis C blood test. Hepatitis B blood test. Sexually transmitted disease (STD) testing. Diabetes screening. This is done by checking your blood sugar (glucose) after you have not eaten for a while (fasting). You may have this done every 1-3 years. Abdominal aortic aneurysm (AAA) screening. You may need this if you are a current or former smoker. Osteoporosis. You may be screened starting at age 24 if you are at high risk. Talk with your health care provider about your test results, treatment options, and if necessary, the need for more tests. Vaccines  Your health care provider may recommend certain vaccines, such as: Influenza vaccine. This is recommended every year. Tetanus, diphtheria, and acellular pertussis (Tdap, Td) vaccine.  You may need a Td booster every 10 years. Zoster vaccine.  You may need this after age 67. Pneumococcal 13-valent conjugate (PCV13) vaccine. One dose is recommended after age 23. Pneumococcal polysaccharide (PPSV23) vaccine. One dose is recommended after age 74. Talk to your health care provider about which screenings and vaccines you need and how often you need them. This information is not intended to replace advice given to you by your health care provider. Make sure you discuss any questions you have with your health care provider. Document Released: 09/08/2015 Document Revised: 05/01/2016 Document Reviewed: 06/13/2015 Elsevier Interactive Patient Education  2017 [place] Prevention in the Home Falls can cause injuries. They can happen to people of all ages. There are many things you can do to make your home safe and to help prevent falls. What can I do on the outside of my home? Regularly fix the edges of walkways and driveways and fix any cracks. Remove anything that might make you trip as you walk through a door, such as a raised step or threshold. Trim any bushes or trees on the path to your home. Use bright outdoor lighting. Clear any walking paths of anything that might make someone trip, such as rocks or tools. Regularly check to see if handrails are loose or broken. Make sure that both sides of any steps have handrails. Any raised decks and porches should have guardrails on the edges. Have any leaves, snow, or ice cleared regularly. Use sand or salt on walking paths during winter. Clean up any spills in your garage right away. This includes oil or grease spills. What can I do in the bathroom? Use night lights. Install grab bars by the toilet and in the tub and shower. Do not use towel bars as grab bars. Use non-skid mats or decals in the tub or shower. If you need to sit down in the shower, use a plastic, non-slip stool. Keep the floor dry. Clean up any water that spills on the floor as soon as it happens. Remove soap  buildup in the tub or shower regularly. Attach bath mats securely with double-sided non-slip rug tape. Do not have throw rugs and other things on the floor that can make you trip. What can I do in the bedroom? Use night lights. Make sure that you have a light by your bed that is easy to reach. Do not use any sheets or blankets that are too big for your bed. They should not hang down onto the floor. Have a firm chair that has side arms. You can use this for support while you get dressed. Do not have throw rugs and other things on the floor that can make you trip. What can I do in the kitchen? Clean up any spills right away. Avoid walking on wet floors. Keep items that you use a lot in easy-to-reach places. If you need to reach something above you, use a strong step stool that has a grab bar. Keep electrical cords out of the way. Do not use floor polish or wax that makes floors slippery. If you must use wax, use non-skid floor wax. Do not have throw rugs and other things on the floor that can make you trip. What can I do with my stairs? Do not leave any items on the stairs. Make sure that there are handrails on both sides of the stairs and use them. Fix handrails that are broken or loose. Make sure that handrails are as long as  the stairways. Check any carpeting to make sure that it is firmly attached to the stairs. Fix any carpet that is loose or worn. Avoid having throw rugs at the top or bottom of the stairs. If you do have throw rugs, attach them to the floor with carpet tape. Make sure that you have a light switch at the top of the stairs and the bottom of the stairs. If you do not have them, ask someone to add them for you. What else can I do to help prevent falls? Wear shoes that: Do not have high heels. Have rubber bottoms. Are comfortable and fit you well. Are closed at the toe. Do not wear sandals. If you use a stepladder: Make sure that it is fully opened. Do not climb a closed  stepladder. Make sure that both sides of the stepladder are locked into place. Ask someone to hold it for you, if possible. Clearly mark and make sure that you can see: Any grab bars or handrails. First and last steps. Where the edge of each step is. Use tools that help you move around (mobility aids) if they are needed. These include: Canes. Walkers. Scooters. Crutches. Turn on the lights when you go into a dark area. Replace any light bulbs as soon as they burn out. Set up your furniture so you have a clear path. Avoid moving your furniture around. If any of your floors are uneven, fix them. If there are any pets around you, be aware of where they are. Review your medicines with your doctor. Some medicines can make you feel dizzy. This can increase your chance of falling. Ask your doctor what other things that you can do to help prevent falls. This information is not intended to replace advice given to you by your health care provider. Make sure you discuss any questions you have with your health care provider. Document Released: 06/08/2009 Document Revised: 01/18/2016 Document Reviewed: 09/16/2014 Elsevier Interactive Patient Education  2017 [employer].

## 2021-10-13 ENCOUNTER — Other Ambulatory Visit: Payer: Self-pay | Admitting: Family Medicine

## 2021-10-15 NOTE — Telephone Encounter (Signed)
E-scribed refill.   Pt had wellness on 09/03/21.  Plz schedule lab and cpe visits.

## 2021-12-16 ENCOUNTER — Encounter: Payer: Self-pay | Admitting: *Deleted

## 2021-12-16 ENCOUNTER — Other Ambulatory Visit: Payer: Self-pay | Admitting: Family Medicine

## 2021-12-25 NOTE — Progress Notes (Signed)
? ? ?Jackee Glasner T. Yula Crotwell, MD, [facility] ?Therapist, music at [facility] ?[place] ?[place], [address] ? ?Phone: [phone]  FAX: [phone] ? ?Erik Edwards - 76 y.o. male  MRN [id number]  Date of Birth: 07/02/1946 ? ?Date: 12/26/2021  PCP: Owens Loffler, MD  Referral: Owens Loffler, MD ? ?Chief Complaint  ?Patient presents with  ? Annual Exam  ? ? ?This visit occurred during the SARS-CoV-2 public health emergency.  Safety protocols were in place, including screening questions prior to the visit, additional usage of staff PPE, and extensive cleaning of exam room while observing appropriate contact time as indicated for disinfecting solutions.  ? ?Patient Care Team: ?Owens Loffler, MD as PCP - General ?Michael Boston, MD as Consulting Physician (General Surgery) ?Sherren Mocha, MD as Consulting Physician (Cardiology) ?Subjective:  ? ?Erik Edwards is a 76 y.o. pleasant patient who presents with the following: ? ?Preventative Health Maintenance Visit: ? ?He is a well-known patient, who are known for many years. ? ?Health Maintenance Summary Reviewed and updated, unless pt declines services. ? ?Tobacco History Reviewed. ?Alcohol: No concerns, no excessive use ?Exercise Habits: Some activity, rec at least 30 mins 5 times a week ?STD concerns: no risk or activity to increase risk ?Drug Use: None ? ?5 days a week for swimming.  ?Not walking as much ? ?Shingrix - no interest ? ?Colon cancer screening? ?COVID BiValent - will hold ?Flu vaccine this year ? ?Wt Readings from Last 3 Encounters:  ?12/26/21 259 lb (117.5 kg)  ?09/03/21 248 lb (112.5 kg)  ?10/19/20 257 lb 12 oz (116.9 kg)  ?  ?Solid 245 pounds ? ?Health Maintenance  ?Topic Date Due  ? Zoster Vaccines- Shingrix (1 of 2) Never done  ? COLONOSCOPY (Pts 45-55yr Insurance coverage will need to be confirmed)  06/11/2020  ? COVID-19 Vaccine (4 - Booster for Pfizer series) 09/01/2020  ? INFLUENZA VACCINE  03/26/2022  ?  TETANUS/TDAP  07/01/2026  ? Pneumonia Vaccine 75 Years old  Completed  ? Hepatitis C Screening  Completed  ? HPV VACCINES  Aged Out  ? ?Immunization History  ?Administered Date(s) Administered  ? Fluad Quad(high Dose 65+) 06/22/2020  ? Influenza, High Dose Seasonal PF 05/28/2019  ? Influenza, Seasonal, Injecte, Preservative Fre 07/27/2012  ? Influenza,inj,Quad PF,6+ Mos 06/17/2013, 06/14/2014, 06/27/2015, 07/08/2016, 06/20/2017, 06/15/2018  ? PFIZER(Purple Top)SARS-COV-2 Vaccination 10/19/2019, 11/09/2019, 07/07/2020  ? Pneumococcal Conjugate-13 12/01/2013  ? Pneumococcal Polysaccharide-23 07/27/2012  ? Tdap 07/01/2016  ? Zoster, Live 07/27/2012  ? ?Patient Active Problem List  ? Diagnosis Date Noted  ? High cholesterol 06/17/2018  ? Prediabetes 06/17/2018  ? [place] 11/20/2016  ? Bilateral inguinal hernias (direct & indirect) s/p laparoscopic repair with mesh 01/02/2017 11/20/2016  ? Fatty liver 11/20/2016  ? GERD (gastroesophageal reflux disease) 12/02/2013  ? Obesity (BMI 30-39.9) 12/02/2013  ? ? ?Past Medical History:  ?Diagnosis Date  ? Diverticulosis   ? GERD (gastroesophageal reflux disease) 12/02/2013  ? History of chicken pox   ? Inguinal hernia 01/03/2016  ? bilateral -repair  ? ? ?Past Surgical History:  ?Procedure Laterality Date  ? INGUINAL HERNIA REPAIR Bilateral 01/02/2017  ? Procedure: LAPAROSCOPIC EXPLORATION AND REPAIR OF BILATERAL INGUINAL HERNIA;  Surgeon: GMichael Boston MD;  [place] [facility]  Service: General;  Laterality: Bilateral;  ? INSERTION OF MESH Bilateral 01/02/2017  ? Procedure: INSERTION OF MESH;  Surgeon: GMichael Boston MD;  [facility]  Service: General;  Laterality: Bilateral;  ? ? ?  Family History  ?Problem Relation Age of Onset  ? Lymphoma Father   ? ? ?Past Medical History, Surgical History, Social History, Family History, Problem List, Medications, and Allergies have been reviewed and updated if relevant. ? ?Review of  Systems: Pertinent positives are listed above.  Otherwise, a full 14 point review of systems has been done in full and it is negative except where it is noted positive. ? ?Objective:  ? ?BP 130/80   Pulse 72   Temp 98.5 ?F (36.9 ?C) (Oral)   Ht '6\' 1"'$  (1.854 m)   Wt 259 lb (117.5 kg)   SpO2 94%   BMI 34.17 kg/m?  ?Ideal Body Weight: Weight in (lb) to have BMI = 25: 189.1 ? ?Ideal Body Weight: Weight in (lb) to have BMI = 25: 189.1 ?No results found. ? ?  12/26/2021  ? 10:19 AM 09/03/2021  ? 10:37 AM 09/01/2020  ? 10:27 AM 06/17/2019  ? 10:48 AM 06/15/2018  ?  9:47 AM  ?Depression screen PHQ 2/9  ?Decreased Interest 0 0 0 0 0  ?Down, Depressed, Hopeless 0 0 0 0 0  ?PHQ - 2 Score 0 0 0 0 0  ?Altered sleeping   0 0 0  ?Tired, decreased energy   0 0 0  ?Change in appetite   0 0 0  ?Feeling bad or failure about yourself    0 0 0  ?Trouble concentrating   0 0 0  ?Moving slowly or fidgety/restless   0 0 0  ?Suicidal thoughts   0 0 0  ?PHQ-9 Score   0 0 0  ?Difficult doing work/chores   Not difficult at all Not difficult at all Not difficult at all  ? ? ? ?GEN: well developed, well nourished, no acute distress ?Eyes: conjunctiva and lids normal, PERRLA, EOMI ?ENT: TM clear, nares clear, oral exam WNL ?Neck: supple, no lymphadenopathy, no thyromegaly, no JVD ?Pulm: clear to auscultation and percussion, respiratory effort normal ?CV: regular rate and rhythm, S1-S2, no murmur, rub or gallop, no bruits, peripheral pulses normal and symmetric, no cyanosis, clubbing, edema or varicosities ?GI: soft, non-tender; no hepatosplenomegaly, masses; active bowel sounds all quadrants ?GU: deferred ?Lymph: no cervical, axillary or inguinal adenopathy ?MSK: gait normal, muscle tone and strength WNL, no joint swelling, effusions, discoloration, crepitus  ?SKIN: clear, good turgor, color WNL, no rashes, lesions, or ulcerations ?Neuro: normal mental status, normal strength, sensation, and motion ?Psych: alert; oriented to person, place and  time, normally interactive and not anxious or depressed in appearance. ? ?All labs reviewed with patient. ?Results for orders placed or performed in visit on 12/26/21  ?Basic metabolic panel  ?Result Value Ref Range  ? Sodium 138 135 - 145 mEq/L  ? Potassium 5.1 3.5 - 5.1 mEq/L  ? Chloride 101 96 - 112 mEq/L  ? CO2 29 19 - 32 mEq/L  ? Glucose, Bld 95 70 - 99 mg/dL  ? BUN 15 6 - 23 mg/dL  ? Creatinine, Ser 1.12 0.40 - 1.50 mg/dL  ? GFR 64.40 >60.00 mL/min  ? Calcium 9.8 8.4 - 10.5 mg/dL  ?CBC with Differential/Platelet  ?Result Value Ref Range  ? WBC 7.0 4.0 - 10.5 K/uL  ? RBC 4.95 4.22 - 5.81 Mil/uL  ? Hemoglobin 16.5 13.0 - 17.0 g/dL  ? HCT 48.7 39.0 - 52.0 %  ? MCV 98.4 78.0 - 100.0 fl  ? MCHC 33.8 30.0 - 36.0 g/dL  ? RDW 14.2 11.5 - 15.5 %  ? Platelets 242.0  150.0 - 400.0 K/uL  ? Neutrophils Relative % 61.9 43.0 - 77.0 %  ? Lymphocytes Relative 19.4 12.0 - 46.0 %  ? Monocytes Relative 15.5 (H) 3.0 - 12.0 %  ? Eosinophils Relative 2.5 0.0 - 5.0 %  ? Basophils Relative 0.7 0.0 - 3.0 %  ? Neutro Abs 4.3 1.4 - 7.7 K/uL  ? Lymphs Abs 1.3 0.7 - 4.0 K/uL  ? Monocytes Absolute 1.1 (H) 0.1 - 1.0 K/uL  ? Eosinophils Absolute 0.2 0.0 - 0.7 K/uL  ? Basophils Absolute 0.1 0.0 - 0.1 K/uL  ?Hepatic function panel  ?Result Value Ref Range  ? Total Bilirubin 1.3 (H) 0.2 - 1.2 mg/dL  ? Bilirubin, Direct 0.2 0.0 - 0.3 mg/dL  ? Alkaline Phosphatase 79 39 - 117 U/L  ? AST 20 0 - 37 U/L  ? ALT 23 0 - 53 U/L  ? Total Protein 6.7 6.0 - 8.3 g/dL  ? Albumin 4.7 3.5 - 5.2 g/dL  ?Hemoglobin A1c  ?Result Value Ref Range  ? Hgb A1c MFr Bld 5.3 4.6 - 6.5 %  ?Lipid panel  ?Result Value Ref Range  ? Cholesterol 199 0 - 200 mg/dL  ? Triglycerides 233.0 (H) 0.0 - 149.0 mg/dL  ? HDL 44.30 >39.00 mg/dL  ? VLDL 46.6 (H) 0.0 - 40.0 mg/dL  ? Total CHOL/HDL Ratio 4   ? NonHDL 154.65   ?PSA, Medicare  ?Result Value Ref Range  ? PSA 1.91 0.10 - 4.00 ng/ml  ?LDL cholesterol, direct  ?Result Value Ref Range  ? Direct LDL 141.0 mg/dL  ?  ? ?Assessment and  Plan:  ? ?  ICD-10-CM   ?1. Healthcare maintenance  Z00.00   ?  ?2. Screen for colon cancer  Z12.11 Ambulatory referral to Gastroenterology  ?  ?3. History of adenomatous polyp of colon  Z86.010 Ambulatory referral to

## 2021-12-26 ENCOUNTER — Ambulatory Visit (INDEPENDENT_AMBULATORY_CARE_PROVIDER_SITE_OTHER): Payer: Medicare Other | Admitting: Family Medicine

## 2021-12-26 ENCOUNTER — Encounter: Payer: Self-pay | Admitting: Family Medicine

## 2021-12-26 ENCOUNTER — Other Ambulatory Visit: Payer: Self-pay

## 2021-12-26 VITALS — BP 130/80 | HR 72 | Temp 98.5°F | Ht 73.0 in | Wt 259.0 lb

## 2021-12-26 DIAGNOSIS — Z0000 Encounter for general adult medical examination without abnormal findings: Secondary | ICD-10-CM | POA: Diagnosis not present

## 2021-12-26 DIAGNOSIS — Z79899 Other long term (current) drug therapy: Secondary | ICD-10-CM

## 2021-12-26 DIAGNOSIS — Z86010 Personal history of colonic polyps: Secondary | ICD-10-CM | POA: Diagnosis not present

## 2021-12-26 DIAGNOSIS — R7303 Prediabetes: Secondary | ICD-10-CM

## 2021-12-26 DIAGNOSIS — Z1211 Encounter for screening for malignant neoplasm of colon: Secondary | ICD-10-CM | POA: Diagnosis not present

## 2021-12-26 DIAGNOSIS — E7800 Pure hypercholesterolemia, unspecified: Secondary | ICD-10-CM | POA: Diagnosis not present

## 2021-12-26 DIAGNOSIS — Z125 Encounter for screening for malignant neoplasm of prostate: Secondary | ICD-10-CM

## 2021-12-26 LAB — CBC WITH DIFFERENTIAL/PLATELET
Basophils Absolute: 0.1 10*3/uL (ref 0.0–0.1)
Basophils Relative: 0.7 % (ref 0.0–3.0)
Eosinophils Absolute: 0.2 10*3/uL (ref 0.0–0.7)
Eosinophils Relative: 2.5 % (ref 0.0–5.0)
HCT: 48.7 % (ref 39.0–52.0)
Hemoglobin: 16.5 g/dL (ref 13.0–17.0)
Lymphocytes Relative: 19.4 % (ref 12.0–46.0)
Lymphs Abs: 1.3 10*3/uL (ref 0.7–4.0)
MCHC: 33.8 g/dL (ref 30.0–36.0)
MCV: 98.4 fl (ref 78.0–100.0)
Monocytes Absolute: 1.1 10*3/uL — ABNORMAL HIGH (ref 0.1–1.0)
Monocytes Relative: 15.5 % — ABNORMAL HIGH (ref 3.0–12.0)
Neutro Abs: 4.3 10*3/uL (ref 1.4–7.7)
Neutrophils Relative %: 61.9 % (ref 43.0–77.0)
Platelets: 242 10*3/uL (ref 150.0–400.0)
RBC: 4.95 Mil/uL (ref 4.22–5.81)
RDW: 14.2 % (ref 11.5–15.5)
WBC: 7 10*3/uL (ref 4.0–10.5)

## 2021-12-26 LAB — HEPATIC FUNCTION PANEL
ALT: 23 U/L (ref 0–53)
AST: 20 U/L (ref 0–37)
Albumin: 4.7 g/dL (ref 3.5–5.2)
Alkaline Phosphatase: 79 U/L (ref 39–117)
Bilirubin, Direct: 0.2 mg/dL (ref 0.0–0.3)
Total Bilirubin: 1.3 mg/dL — ABNORMAL HIGH (ref 0.2–1.2)
Total Protein: 6.7 g/dL (ref 6.0–8.3)

## 2021-12-26 LAB — PSA, MEDICARE: PSA: 1.91 ng/ml (ref 0.10–4.00)

## 2021-12-26 LAB — BASIC METABOLIC PANEL
BUN: 15 mg/dL (ref 6–23)
CO2: 29 mEq/L (ref 19–32)
Calcium: 9.8 mg/dL (ref 8.4–10.5)
Chloride: 101 mEq/L (ref 96–112)
Creatinine, Ser: 1.12 mg/dL (ref 0.40–1.50)
GFR: 64.4 mL/min (ref >60.00)
Glucose, Bld: 95 mg/dL (ref 70–99)
Potassium: 5.1 mEq/L (ref 3.5–5.1)
Sodium: 138 mEq/L (ref 135–145)

## 2021-12-26 LAB — LIPID PANEL
Cholesterol: 199 mg/dL (ref 0–200)
HDL: 44.3 mg/dL (ref >39.00)
NonHDL: 154.65
Total CHOL/HDL Ratio: 4
Triglycerides: 233 mg/dL — ABNORMAL HIGH (ref 0.0–149.0)
VLDL: 46.6 mg/dL — ABNORMAL HIGH (ref 0.0–40.0)

## 2021-12-26 LAB — HEMOGLOBIN A1C: Hgb A1c MFr Bld: 5.3 % (ref 4.6–6.5)

## 2021-12-26 LAB — LDL CHOLESTEROL, DIRECT: Direct LDL: 141 mg/dL

## 2021-12-26 MED ORDER — NA SULFATE-K SULFATE-MG SULF 17.5-3.13-1.6 GM/177ML PO SOLN: 1.0000 | Freq: Once | ORAL | 0 refills | Status: AC

## 2021-12-26 NOTE — Progress Notes (Signed)
Gastroenterology Pre-Procedure Review ? ?Request Date: 01/23/2022 ?Requesting Physician: Dr. Marius Ditch ? ?PATIENT REVIEW QUESTIONS: The patient responded to the following health history questions as indicated:   ? ?1. Are you having any GI issues? no ?2. Do you have a personal history of Polyps? yes (last colonoscopy) ?3. Do you have a family history of Colon Cancer or Polyps? no ?4. Diabetes Mellitus? no ?5. Joint replacements in the past 12 months?no ?6. Major health problems in the past 3 months?no ?7. Any artificial heart valves, MVP, or defibrillator?no ?   ?MEDICATIONS & ALLERGIES:    ?Patient reports the following regarding taking any anticoagulation/antiplatelet therapy:   ?Plavix, Coumadin, Eliquis, Xarelto, Lovenox, Pradaxa, Brilinta, or Effient? no ?Aspirin? yes (81 mg) ? ?Patient confirms/reports the following medications:  ?Current Outpatient Medications  ?Medication Sig Dispense Refill  ? aspirin EC 81 MG tablet Take 81 mg by mouth every morning.    ? famotidine (PEPCID) 20 MG tablet TAKE 1 TABLET BY MOUTH TWICE  DAILY 180 tablet 0  ? Multiple Vitamins-Minerals (MULTIVITAMINS THER. W/MINERALS) TABS Take 1 tablet by mouth daily.    ? omeprazole (PRILOSEC) 20 MG capsule TAKE 1 CAPSULE BY MOUTH DAILY 90 capsule 0  ? ?No current facility-administered medications for this visit.  ? ? ?Patient confirms/reports the following allergies:  ?No Known Allergies ? ?No orders of the defined types were placed in this encounter. ? ? ?AUTHORIZATION INFORMATION ?Primary Insurance: ?1D#: ?Group #: ? ?Secondary Insurance: ?1D#: ?Group #: ? ?SCHEDULE INFORMATION: ?Date: 01/23/2022 ?Time: ?Location: ?[facility] ?

## 2022-01-22 ENCOUNTER — Encounter: Payer: Self-pay | Admitting: Gastroenterology

## 2022-01-23 ENCOUNTER — Ambulatory Visit: Payer: Medicare Other | Admitting: Anesthesiology

## 2022-01-23 ENCOUNTER — Encounter
Admission: RE | Disposition: A | Discharge status: Home / Self Care | Payer: Self-pay | Source: Home / Self Care | Attending: Gastroenterology

## 2022-01-23 ENCOUNTER — Ambulatory Visit
Admission: RE | Admit: 2022-01-23 | Discharge: 2022-01-23 | Disposition: A | Discharge status: Home / Self Care | Payer: Medicare Other | Attending: Gastroenterology | Admitting: Gastroenterology

## 2022-01-23 ENCOUNTER — Encounter: Payer: Self-pay | Admitting: Gastroenterology

## 2022-01-23 DIAGNOSIS — Z87891 Personal history of nicotine dependence: Secondary | ICD-10-CM | POA: Diagnosis not present

## 2022-01-23 DIAGNOSIS — Z1211 Encounter for screening for malignant neoplasm of colon: Secondary | ICD-10-CM

## 2022-01-23 DIAGNOSIS — K5730 Diverticulosis of large intestine without perforation or abscess without bleeding: Secondary | ICD-10-CM | POA: Diagnosis not present

## 2022-01-23 DIAGNOSIS — Z860100 Personal history of colon polyps, unspecified: Secondary | ICD-10-CM

## 2022-01-23 DIAGNOSIS — K644 Residual hemorrhoidal skin tags: Secondary | ICD-10-CM | POA: Diagnosis not present

## 2022-01-23 DIAGNOSIS — K649 Unspecified hemorrhoids: Secondary | ICD-10-CM | POA: Diagnosis not present

## 2022-01-23 HISTORY — PX: COLONOSCOPY WITH PROPOFOL: SHX5780

## 2022-01-23 SURGERY — COLONOSCOPY WITH PROPOFOL
Anesthesia: General

## 2022-01-23 MED ORDER — PROPOFOL 10 MG/ML IV BOLUS
INTRAVENOUS | Status: AC
  Filled 2022-01-23: qty 20

## 2022-01-23 MED ORDER — LIDOCAINE HCL (PF) 2 % IJ SOLN
INTRAMUSCULAR | Status: AC
  Filled 2022-01-23: qty 5

## 2022-01-23 MED ORDER — MIDAZOLAM HCL 2 MG/2ML IJ SOLN
INTRAMUSCULAR | Status: AC
  Filled 2022-01-23: qty 2

## 2022-01-23 MED ORDER — LIDOCAINE HCL (CARDIAC) PF 100 MG/5ML IV SOSY
PREFILLED_SYRINGE | INTRAVENOUS | Status: DC | PRN
  Administered 2022-01-23: 100 mg via INTRAVENOUS

## 2022-01-23 MED ORDER — PROPOFOL 1000 MG/100ML IV EMUL
INTRAVENOUS | Status: AC
  Filled 2022-01-23: qty 100

## 2022-01-23 MED ORDER — PROPOFOL 10 MG/ML IV BOLUS
INTRAVENOUS | Status: DC | PRN
  Administered 2022-01-23: 100 mg via INTRAVENOUS
  Administered 2022-01-23: 95 ug/kg/min via INTRAVENOUS

## 2022-01-23 MED ORDER — SODIUM CHLORIDE 0.9 % IV SOLN: INTRAVENOUS | Status: DC

## 2022-01-23 NOTE — H&P (Signed)
Cephas Darby, MD [address]  [place]  [place], [place] [address]  Main: [phone]  Fax: [phone] Pager: [phone]  Primary Care Physician:  Owens Loffler, MD Primary Gastroenterologist:  Dr. Cephas Darby  Pre-Procedure History & Physical: HPI:  JOSEAN LYCAN is a 76 y.o. male is here for an colonoscopy.   Past Medical History:  Diagnosis Date   Diverticulosis    GERD (gastroesophageal reflux disease) 12/02/2013   History of chicken pox    Inguinal hernia 01/03/2016   bilateral -repair    Past Surgical History:  Procedure Laterality Date   HERNIA REPAIR     INGUINAL HERNIA REPAIR Bilateral 01/02/2017   Procedure: LAPAROSCOPIC EXPLORATION AND REPAIR OF BILATERAL INGUINAL HERNIA;  Surgeon: Michael Boston, MD;  Location: [place];  Service: General;  Laterality: Bilateral;   INSERTION OF MESH Bilateral 01/02/2017   Procedure: INSERTION OF MESH;  Surgeon: Michael Boston, MD;  Location: [place];  Service: General;  Laterality: Bilateral;    Prior to Admission medications   Medication Sig Start Date End Date Taking? Authorizing Provider  aspirin EC 81 MG tablet Take 81 mg by mouth every morning.   Yes [provider]  famotidine (PEPCID) 20 MG tablet TAKE 1 TABLET BY MOUTH TWICE  DAILY 12/16/21  Yes Copland, Frederico Hamman, MD  Multiple Vitamins-Minerals (MULTIVITAMINS THER. W/MINERALS) TABS Take 1 tablet by mouth daily.   Yes [provider]  omeprazole (PRILOSEC) 20 MG capsule TAKE 1 CAPSULE BY MOUTH DAILY 12/16/21  Yes Copland, Frederico Hamman, MD    Allergies as of 12/26/2021   (No Known Allergies)    Family History  Problem Relation Age of Onset   Lymphoma Father     Social History   Socioeconomic History   Marital status: Married    Spouse name: Not on file   Number of children: 2   Years of education: Not on file   Highest education level: Not on file  Occupational History   Occupation: retired   Tobacco Use   Smoking status: Former    Types: Cigarettes   Smokeless tobacco: Never  Scientific laboratory technician Use: Never used  Substance and Sexual Activity   Alcohol use: No   Drug use: No   Sexual activity: Not on file  Other Topics Concern   Not on file  Social History Narrative   Not on file   Social Determinants of Health   Financial Resource Strain: Low Risk    Difficulty of Paying Living Expenses: Not hard at all  Food Insecurity: No Food Insecurity   Worried About Charity fundraiser in the Last Year: Never true   Arboriculturist in the Last Year: Never true  Transportation Needs: No Transportation Needs   Lack of Transportation (Medical): No   Lack of Transportation (Non-Medical): No  Physical Activity: Insufficiently Active   Days of Exercise per Week: 3 days   Minutes of Exercise per Session: 40 min  Stress: No Stress Concern Present   Feeling of Stress : Not at all  Social Connections: Moderately Integrated   Frequency of Communication with Friends and Family: More than three times a week   Frequency of Social Gatherings with Friends and Family: More than three times a week   Attends Religious Services: More than 4 times per year   Active Member of [employer] or Organizations: No   Attends Archivist Meetings: Never   Marital Status: Married  Intimate Partner Violence: Not At Risk   Fear of Current or Ex-Partner: No   Emotionally Abused: No   Physically Abused: No   Sexually Abused: No    Review of Systems: See HPI, otherwise negative ROS  Physical Exam: BP (!) 157/82   Pulse 77   Temp (!) 97 F (36.1 C) (Temporal)   Resp 18   Ht '6\' 2"'$  (1.88 m)   Wt 111.1 kg   SpO2 96%   BMI 31.46 kg/m  General:   Alert,  pleasant and cooperative in NAD Head:  Normocephalic and atraumatic. Neck:  Supple; no masses or thyromegaly. Lungs:  Clear throughout to auscultation.    Heart:  Regular rate and rhythm. Abdomen:  Soft, nontender and nondistended. Normal  bowel sounds, without guarding, and without rebound.   Neurologic:  Alert and  oriented x4;  grossly normal neurologically.  Impression/Plan: Jeyren Danowski Dominique is here for an colonoscopy to be performed for colon cancer screening  Risks, benefits, limitations, and alternatives regarding  colonoscopy have been reviewed with the patient.  Questions have been answered.  All parties agreeable.   Sherri Sear, MD  01/23/2022, 8:14 AM

## 2022-01-23 NOTE — Transfer of Care (Signed)
Immediate Anesthesia Transfer of Care Note  Patient: Erik Edwards  Procedure(s) Performed: COLONOSCOPY WITH PROPOFOL  Patient Location: PACU  Anesthesia Type:General  Level of Consciousness: drowsy  Airway & Oxygen Therapy: Patient Spontanous Breathing  Post-op Assessment: Report given to RN and Post -op Vital signs reviewed and stable  Post vital signs: Reviewed and stable  Last Vitals:  Vitals Value Taken Time  BP 101/59 01/23/22 0858  Temp 35.7 C 01/23/22 0855  Pulse 62 01/23/22 0855  Resp 18 01/23/22 0855  SpO2 96 % 01/23/22 0855    Last Pain:  Vitals:   01/23/22 0855  TempSrc: Temporal  PainSc: Asleep         Complications: No notable events documented.

## 2022-01-23 NOTE — Op Note (Signed)
[facility] Patient Name: Erik Edwards Procedure Date: 01/23/2022 8:19 AM MRN: [id number] Account #: [id number] Date of Birth: Apr 25, 1946 Admit Type: Outpatient Age: 76 Room: [facility] ENDO ROOM 4 Gender: Male Note Status: Finalized Instrument Name: Colonoscope [id number] Procedure:             Colonoscopy Indications:           Screening for colorectal malignant neoplasm Providers:             Lin Landsman MD, MD Referring MD:          Maud Deed. Copland MD, MD (Referring MD) Medicines:             General Anesthesia Complications:         No immediate complications. Estimated blood loss: None. Procedure:             Pre-Anesthesia Assessment:                        - Prior to the procedure, a History and Physical was                         performed, and patient medications and allergies were                         reviewed. The patient is competent. The risks and                         benefits of the procedure and the sedation options and                         risks were discussed with the patient. All questions                         were answered and informed consent was obtained.                         Patient identification and proposed procedure were                         verified by the physician, the nurse, the                         anesthesiologist, the anesthetist and the technician                         in the pre-procedure area in the procedure room in the                         endoscopy suite. Mental Status Examination: alert and                         oriented. Airway Examination: normal oropharyngeal                         airway and neck mobility. Respiratory Examination:                         clear to auscultation. CV Examination: normal.  Prophylactic Antibiotics: The patient does not require                         prophylactic antibiotics. Prior Anticoagulants: The                          patient has taken no previous anticoagulant or                         antiplatelet agents. ASA Grade Assessment: III - A                         patient with severe systemic disease. After reviewing                         the risks and benefits, the patient was deemed in                         satisfactory condition to undergo the procedure. The                         anesthesia plan was to use general anesthesia.                         Immediately prior to administration of medications,                         the patient was re-assessed for adequacy to receive                         sedatives. The heart rate, respiratory rate, oxygen                         saturations, blood pressure, adequacy of pulmonary                         ventilation, and response to care were monitored                         throughout the procedure. The physical status of the                         patient was re-assessed after the procedure.                        After obtaining informed consent, the colonoscope was                         passed under direct vision. Throughout the procedure,                         the patient's blood pressure, pulse, and oxygen                         saturations were monitored continuously. The                         Colonoscope was introduced through the anus and  advanced to the the cecum, identified by appendiceal                         orifice and ileocecal valve. The colonoscopy was                         performed with moderate difficulty due to significant                         looping and the patient's body habitus. Successful                         completion of the procedure was aided by applying                         abdominal pressure. The patient tolerated the                         procedure well. The quality of the bowel preparation                         was evaluated using the BBPS [facility] Bowel Preparation                          Scale) with scores of: Right Colon = 3, Transverse                         Colon = 3 and Left Colon = 3 (entire mucosa seen well                         with no residual staining, small fragments of stool or                         opaque liquid). The total BBPS score equals 9. Findings:      The perianal and digital rectal examinations were normal. Pertinent       negatives include normal sphincter tone and no palpable rectal lesions.      A few large-mouthed diverticula were found in the entire colon.      Non-bleeding external hemorrhoids were found during retroflexion. The       hemorrhoids were medium-sized. Impression:            - Diverticulosis in the entire examined colon.                        - Non-bleeding external hemorrhoids.                        - No specimens collected. Recommendation:        - Discharge patient to home (with escort).                        - Resume previous diet today.                        - Continue present medications.                        - Repeat colonoscopy in  10 years for screening                         purposes based on patient's medical condition at that                         time. Procedure Code(s):     --- Professional ---                        G8185, Colorectal cancer screening; colonoscopy on                         individual not meeting criteria for high risk Diagnosis Code(s):     --- Professional ---                        Z12.11, Encounter for screening for malignant neoplasm                         of colon                        K64.4, Residual hemorrhoidal skin tags                        K57.30, Diverticulosis of large intestine without                         perforation or abscess without bleeding CPT copyright 2019 American Medical Association. All rights reserved. The codes documented in this report are preliminary and upon coder review may  be revised to meet current compliance requirements. Dr.  Ulyess Mort Lin Landsman MD, MD 01/23/2022 8:54:55 AM This report has been signed electronically. Number of Addenda: 0 Note Initiated On: 01/23/2022 8:19 AM Scope Withdrawal Time: 0 hours 10 minutes 0 seconds  Total Procedure Duration: 0 hours 21 minutes 50 seconds  Estimated Blood Loss:  Estimated blood loss: none.      [facility]

## 2022-01-23 NOTE — Anesthesia Preprocedure Evaluation (Signed)
Anesthesia Evaluation  Patient identified by MRN, date of birth, ID band Patient awake    Reviewed: Allergy & Precautions, NPO status , Patient's Chart, lab work & pertinent test results  History of Anesthesia Complications Negative for: history of anesthetic complications  Airway Mallampati: III  TM Distance: >3 FB Neck ROM: Full    Dental no notable dental hx. (+) Dental Advisory Given   Pulmonary neg pulmonary ROS, neg sleep apnea, neg COPD, Patient abstained from smoking.Not current smoker, former smoker,    Pulmonary exam normal breath sounds clear to auscultation       Cardiovascular Exercise Tolerance: Good METS(-) hypertension(-) CAD and (-) Past MI negative cardio ROS  (-) dysrhythmias  Rhythm:Regular Rate:Normal - Systolic murmurs '13 cardiac CT: very minimal ASCADz   Neuro/Psych negative neurological ROS  negative psych ROS   GI/Hepatic Neg liver ROS, GERD  Medicated and Controlled,  Endo/Other  neg diabetesMorbid obesity  Renal/GU negative Renal ROS     Musculoskeletal   Abdominal (+) + obese,   Peds  Hematology negative hematology ROS (+)   Anesthesia Other Findings Past Medical History: No date: Diverticulosis 12/02/2013: GERD (gastroesophageal reflux disease) No date: History of chicken pox 01/03/2016: Inguinal hernia     Comment:  bilateral -repair  Reproductive/Obstetrics                             Anesthesia Physical  Anesthesia Plan  ASA: 2  Anesthesia Plan: General   Post-op Pain Management: Minimal or no pain anticipated   Induction: Intravenous  PONV Risk Score and Plan: 2 and Propofol infusion, TIVA and Ondansetron  Airway Management Planned: Natural Airway  Additional Equipment: None  Intra-op Plan:   Post-operative Plan:   Informed Consent: I have reviewed the patients History and Physical, chart, labs and discussed the procedure including the  risks, benefits and alternatives for the proposed anesthesia with the patient or authorized representative who has indicated his/her understanding and acceptance.     Dental advisory given  Plan Discussed with: CRNA and Surgeon  Anesthesia Plan Comments: (Discussed risks of anesthesia with patient, including possibility of difficulty with spontaneous ventilation under anesthesia necessitating airway intervention, PONV, and rare risks such as cardiac or respiratory or neurological events, and allergic reactions. Discussed the role of CRNA in patient's perioperative care. Patient understands.)        Anesthesia Quick Evaluation

## 2022-01-23 NOTE — Anesthesia Postprocedure Evaluation (Signed)
Anesthesia Post Note  Patient: Erik Edwards Blew  Procedure(s) Performed: COLONOSCOPY WITH PROPOFOL  Patient location during evaluation: Endoscopy Anesthesia Type: General Level of consciousness: awake and alert Pain management: pain level controlled Vital Signs Assessment: post-procedure vital signs reviewed and stable Respiratory status: spontaneous breathing, nonlabored ventilation, respiratory function stable and patient connected to nasal cannula oxygen Cardiovascular status: blood pressure returned to baseline and stable Postop Assessment: no apparent nausea or vomiting Anesthetic complications: no   No notable events documented.   Last Vitals:  Vitals:   01/23/22 0904 01/23/22 0914  BP: (!) 91/49 120/73  Pulse: (!) 59 64  Resp: 16 14  Temp:    SpO2: 94% 98%    Last Pain:  Vitals:   01/23/22 0914  TempSrc:   PainSc: 0-No pain                 Arita Miss

## 2022-01-24 ENCOUNTER — Encounter: Payer: Self-pay | Admitting: Gastroenterology

## 2022-03-09 ENCOUNTER — Other Ambulatory Visit: Payer: Self-pay | Admitting: Family Medicine

## 2022-05-30 DIAGNOSIS — L565 Disseminated superficial actinic porokeratosis (DSAP): Secondary | ICD-10-CM | POA: Diagnosis not present

## 2022-05-30 DIAGNOSIS — L821 Other seborrheic keratosis: Secondary | ICD-10-CM | POA: Diagnosis not present

## 2022-05-30 DIAGNOSIS — Z85828 Personal history of other malignant neoplasm of skin: Secondary | ICD-10-CM | POA: Diagnosis not present

## 2022-05-30 DIAGNOSIS — L570 Actinic keratosis: Secondary | ICD-10-CM | POA: Diagnosis not present

## 2022-05-30 DIAGNOSIS — D1801 Hemangioma of skin and subcutaneous tissue: Secondary | ICD-10-CM | POA: Diagnosis not present

## 2022-09-04 ENCOUNTER — Ambulatory Visit (INDEPENDENT_AMBULATORY_CARE_PROVIDER_SITE_OTHER): Payer: Medicare Other

## 2022-09-04 VITALS — Wt 252.0 lb

## 2022-09-04 DIAGNOSIS — Z0000 Encounter for general adult medical examination without abnormal findings: Secondary | ICD-10-CM | POA: Diagnosis not present

## 2022-09-04 NOTE — Progress Notes (Signed)
I connected with  Erik Edwards on 09/04/22 by a audio enabled telemedicine application and verified that I am speaking with the correct person using two identifiers.  Patient Location: Home  Provider Location: Office/Clinic  I discussed the limitations of evaluation and management by telemedicine. The patient expressed understanding and agreed to proceed.   Subjective:   Erik Edwards is a 77 y.o. male who presents for Medicare Annual/Subsequent preventive examination.  Review of Systems     Cardiac Risk Factors include: advanced age (>68mn, >>16women);obesity (BMI >30kg/m2)     Objective:    Today's Vitals   09/04/22 1017  Weight: 252 lb (114.3 kg)   Body mass index is 32.35 kg/m.     09/04/2022   10:22 AM 01/23/2022    7:29 AM 09/03/2021   10:34 AM 09/01/2020   10:26 AM 06/17/2019   10:46 AM 06/15/2018    9:52 AM 01/02/2017    9:50 AM  Advanced Directives  Does Patient Have a Medical Advance Directive? Yes No No Yes Yes No No  Type of [place]of [place] will   [place] will    Copy of [place]? No - copy requested   No - copy requested No - copy requested    Would patient like information on creating a medical advance directive?   Yes (MAU/Ambulatory/Procedural Areas - Information given)   No - Patient declined No - Patient declined    Current Medications (verified) Outpatient Encounter Medications as of 09/04/2022  Medication Sig   aspirin EC 81 MG tablet Take 81 mg by mouth every morning.   famotidine (PEPCID) 20 MG tablet TAKE 1 TABLET BY MOUTH TWICE  DAILY   Multiple Vitamins-Minerals (MULTIVITAMINS THER. W/MINERALS) TABS Take 1 tablet by mouth daily.   omeprazole (PRILOSEC) 20 MG capsule TAKE 1 CAPSULE BY MOUTH DAILY   No facility-administered encounter medications on file as of 09/04/2022.    Allergies (verified) Patient has no  known allergies.   History: Past Medical History:  Diagnosis Date   Diverticulosis    GERD (gastroesophageal reflux disease) 12/02/2013   History of chicken pox    Inguinal hernia 01/03/2016   bilateral -repair   Past Surgical History:  Procedure Laterality Date   COLONOSCOPY WITH PROPOFOL N/A 01/23/2022   Procedure: COLONOSCOPY WITH PROPOFOL;  Surgeon: VLin Landsman MD;  [facility]  Service: Gastroenterology;  Laterality: N/A;   HERNIA REPAIR     INGUINAL HERNIA REPAIR Bilateral 01/02/2017   Procedure: LAPAROSCOPIC EXPLORATION AND REPAIR OF BILATERAL INGUINAL HERNIA;  Surgeon: GMichael Boston MD;  [place] [facility]  Service: General;  Laterality: Bilateral;   INSERTION OF MESH Bilateral 01/02/2017   Procedure: INSERTION OF MESH;  Surgeon: GMichael Boston MD;  [place] [facility]  Service: General;  Laterality: Bilateral;   Family History  Problem Relation Age of Onset   Lymphoma Father    Social History   Socioeconomic History   Marital status: Married    Spouse name: Not on file   Number of children: 2   Years of education: Not on file   Highest education level: Not on file  Occupational History   Occupation: retired  Tobacco Use   Smoking status: Former    Types: Cigarettes   Smokeless tobacco: Never  Vaping Use   Vaping Use: Never used  Substance and Sexual Activity   Alcohol use: No  Drug use: No   Sexual activity: Not on file  Other Topics Concern   Not on file  Social History Narrative   Not on file   Social Determinants of Health   Financial Resource Strain: Low Risk  (09/04/2022)   Overall Financial Resource Strain (CARDIA)    Difficulty of Paying Living Expenses: Not hard at all  Food Insecurity: No Food Insecurity (09/04/2022)   Hunger Vital Sign    Worried About Running Out of Food in the Last Year: Never true    Ran Out of Food in the Last Year: Never true  Transportation Needs: No  Transportation Needs (09/04/2022)   PRAPARE - Hydrologist (Medical): No    Lack of Transportation (Non-Medical): No  Physical Activity: Insufficiently Active (09/04/2022)   Exercise Vital Sign    Days of Exercise per Week: 3 days    Minutes of Exercise per Session: 20 min  Stress: No Stress Concern Present (09/04/2022)   [place]    Feeling of Stress : Not at all  Social Connections: Moderately Integrated (09/04/2022)   Social Connection and Isolation Panel [NHANES]    Frequency of Communication with Friends and Family: More than three times a week    Frequency of Social Gatherings with Friends and Family: More than three times a week    Attends Religious Services: More than 4 times per year    Active Member of [employer] or Organizations: No    Attends Music therapist: Never    Marital Status: Married    Tobacco Counseling Counseling given: Not Answered   Clinical Intake:  Pre-visit preparation completed: Yes  Pain : No/denies pain     BMI - recorded: 32.35 Nutritional Status: BMI > 30  Obese Nutritional Risks: None Diabetes: No  How often do you need to have someone help you when you read instructions, pamphlets, or other written materials from your doctor or pharmacy?: 1 - Never  Diabetic?no  Interpreter Needed?: No  Information entered by :: Charlott Rakes, LPN   Activities of Daily Living    09/04/2022   10:23 AM 08/28/2022    7:36 AM  In your present state of health, do you have any difficulty performing the following activities:  Hearing? 0 0  Vision? 0 0  Difficulty concentrating or making decisions? 0 0  Walking or climbing stairs? 0 0  Dressing or bathing? 0 0  Doing errands, shopping? 0 0  Preparing Food and eating ? N N  Using the Toilet? N N  In the past six months, have you accidently leaked urine? N N  Do you have problems with loss of bowel  control? N N  Managing your Medications? N N  Managing your Finances? N N  Housekeeping or managing your Housekeeping? N N    Patient Care Team: Owens Loffler, MD as PCP - Huston Foley, MD as Consulting Physician (General Surgery) Sherren Mocha, MD as Consulting Physician (Cardiology)  Indicate any recent Medical Services you may have received from other than [facility] providers in the past year (date may be approximate).     Assessment:   This is a routine wellness examination for Adem.  Hearing/Vision screen Hearing Screening - Comments:: Pt denies any hearing issues  Vision Screening - Comments:: Pt follows up with [facility] for annual eye exams   Dietary issues and exercise activities discussed:     Goals Addressed  This Visit's Progress    Patient Stated       Get back to walking more for exercise       Depression Screen    09/04/2022   10:21 AM 12/26/2021   10:19 AM 09/03/2021   10:37 AM 09/01/2020   10:27 AM 06/17/2019   10:48 AM 06/15/2018    9:47 AM 11/20/2016   10:54 AM  PHQ 2/9 Scores  PHQ - 2 Score 0 0 0 0 0 0 0  PHQ- 9 Score    0 0 0     Fall Risk    09/04/2022   10:23 AM 08/28/2022    7:36 AM 12/26/2021   10:28 AM 09/03/2021   10:35 AM 09/01/2020   10:27 AM  Fall Risk   Falls in the past year? 0 0 0 0 0  Number falls in past yr: 0   0 0  Injury with Fall? 0   0 0  Risk for fall due to : Impaired vision    No Fall Risks  Follow up Falls prevention discussed   Falls prevention discussed Falls evaluation completed;Falls prevention discussed    FALL RISK PREVENTION PERTAINING TO THE HOME:  Any stairs in or around the home? Yes  If so, are there any without handrails? No  Home free of loose throw rugs in walkways, pet beds, electrical cords, etc? Yes  Adequate lighting in your home to reduce risk of falls? Yes   ASSISTIVE DEVICES UTILIZED TO PREVENT FALLS:  Life alert? No  Use of a cane, walker or w/c? No  Grab bars in the  bathroom? No  Shower chair or bench in shower? Yes  Elevated toilet seat or a handicapped toilet? No   TIMED UP AND GO:  Was the test performed? No .    Cognitive Function:    09/01/2020   10:31 AM 06/17/2019   10:50 AM 06/15/2018    9:47 AM 11/20/2016   10:57 AM 11/20/2016   10:54 AM  MMSE - Mini Mental State Exam  Orientation to time '5 5 5 5 5  '$ Orientation to Place '5 5 5 5 5  '$ Registration '3 3 3 3 3  '$ Attention/ Calculation 5 5 0 0 0  Recall '3 3 3 3 3  '$ Language- name 2 objects   0 0 0  Language- repeat '1 1 1 1 1  '$ Language- follow 3 step command   '3 3 3  '$ Language- read & follow direction   0 0 0  Write a sentence   0 0 0  Copy design   0 0 0  Total score   '20 20 20        '$ 09/04/2022   10:24 AM  6CIT Screen  What Year? 0 points  What month? 0 points  What time? 0 points  Count back from 20 0 points  Months in reverse 0 points  Repeat phrase 0 points  Total Score 0 points    Immunizations Immunization History  Administered Date(s) Administered   Fluad Quad(high Dose 65+) 06/22/2020   Influenza, High Dose Seasonal PF 05/28/2019   Influenza, Seasonal, Injecte, Preservative Fre 07/27/2012   Influenza,inj,Quad PF,6+ Mos 06/17/2013, 06/14/2014, 06/27/2015, 07/08/2016, 06/20/2017, 06/15/2018   PFIZER(Purple Top)SARS-COV-2 Vaccination 10/19/2019, 11/09/2019, 07/07/2020   Pneumococcal Conjugate-13 12/01/2013   Pneumococcal Polysaccharide-23 07/27/2012   Tdap 07/01/2016   Zoster, Live 07/27/2012    TDAP status: Up to date  Flu Vaccine status: Due, Education has been provided regarding the importance of  this vaccine. Advised may receive this vaccine at local pharmacy or Health Dept. Aware to provide a copy of the vaccination record if obtained from local pharmacy or Health Dept. Verbalized acceptance and understanding.  Pneumococcal vaccine status: Up to date  Covid-19 vaccine status: Completed vaccines  Qualifies for Shingles Vaccine? Yes   Zostavax completed No    Shingrix Completed?: No.    Education has been provided regarding the importance of this vaccine. Patient has been advised to call insurance company to determine out of pocket expense if they have not yet received this vaccine. Advised may also receive vaccine at local pharmacy or Health Dept. Verbalized acceptance and understanding.  Screening Tests Health Maintenance  Topic Date Due   Zoster Vaccines- Shingrix (1 of 2) Never done   INFLUENZA VACCINE  03/26/2022   COVID-19 Vaccine (4 - 2023-24 season) 04/26/2022   Medicare Annual Wellness (AWV)  09/05/2023   DTaP/Tdap/Td (2 - Td or Tdap) 07/01/2026   COLONOSCOPY (Pts 45-19yr Insurance coverage will need to be confirmed)  01/24/2027   Pneumonia Vaccine 77 Years old  Completed   Hepatitis C Screening  Completed   HPV VACCINES  Aged Out    Health Maintenance  Health Maintenance Due  Topic Date Due   Zoster Vaccines- Shingrix (1 of 2) Never done   INFLUENZA VACCINE  03/26/2022   COVID-19 Vaccine (4 - 2023-24 season) 04/26/2022    Colorectal cancer screening: Type of screening: Colonoscopy. Completed 01/23/22. Repeat every 5 years   Additional Screening:  Vision Screening: Recommended annual ophthalmology exams for early detection of glaucoma and other disorders of the eye. Is the patient up to date with their annual eye exam?  Yes  Who is the provider or what is the name of the office in which the patient attends annual eye exams? [facility]  If pt is not established with a provider, would they like to be referred to a provider to establish care? No .   Dental Screening: Recommended annual dental exams for proper oral hygiene  Community Resource Referral / Chronic Care Management: CRR required this visit?  No   CCM required this visit?  No      Plan:     I have personally reviewed and noted the following in the patient's chart:   Medical and social history Use of alcohol, tobacco or illicit drugs  Current  medications and supplements including opioid prescriptions. Patient is not currently taking opioid prescriptions. Functional ability and status Nutritional status Physical activity Advanced directives List of other physicians Hospitalizations, surgeries, and ER visits in previous 12 months Vitals Screenings to include cognitive, depression, and falls Referrals and appointments  In addition, I have reviewed and discussed with patient certain preventive protocols, quality metrics, and best practice recommendations. A written personalized care plan for preventive services as well as general preventive health recommendations were provided to patient.     TWillette Brace LPN   08/23/7988  Nurse Notes: none

## 2022-09-04 NOTE — Patient Instructions (Signed)
Erik Edwards , Thank you for taking time to come for your Medicare Wellness Visit. I appreciate your ongoing commitment to your health goals. Please review the following plan we discussed and let me know if I can assist you in the future.   These are the goals we discussed:  Goals      Increase physical activity     Starting 06/15/2018, I will walk for 30 min daily as weather permits.      Patient Stated     06/17/2019, I will increase my exercise by walking more frequently and for longer.      Patient Stated     09/01/2020, I will continue to walk 3 days a week for 30 minutes.      Patient Stated     Would like to maintain current routine     Patient Stated     Get back to walking more for exercise        This is a list of the screening recommended for you and due dates:  Health Maintenance  Topic Date Due   Zoster (Shingles) Vaccine (1 of 2) Never done   Flu Shot  03/26/2022   COVID-19 Vaccine (4 - 2023-24 season) 04/26/2022   Medicare Annual Wellness Visit  09/05/2023   DTaP/Tdap/Td vaccine (2 - Td or Tdap) 07/01/2026   Colon Cancer Screening  01/24/2027   Pneumonia Vaccine  Completed   Hepatitis C Screening: USPSTF Recommendation to screen - Ages 18-79 yo.  Completed   HPV Vaccine  Aged Out    Advanced directives: Please bring a copy of your health care power of attorney and living will to the office at your convenience.  Conditions/risks identified: continue walking for exercise   Next appointment: Follow up in one year for your annual wellness visit.   Preventive Care 35 Years and Older, Male  Preventive care refers to lifestyle choices and visits with your health care provider that can promote health and wellness. What does preventive care include? A yearly physical exam. This is also called an annual well check. Dental exams once or twice a year. Routine eye exams. Ask your health care provider how often you should have your eyes checked. Personal lifestyle  choices, including: Daily care of your teeth and gums. Regular physical activity. Eating a healthy diet. Avoiding tobacco and drug use. Limiting alcohol use. Practicing safe sex. Taking low doses of aspirin every day. Taking vitamin and mineral supplements as recommended by your health care provider. What happens during an annual well check? The services and screenings done by your health care provider during your annual well check will depend on your age, overall health, lifestyle risk factors, and family history of disease. Counseling  Your health care provider may ask you questions about your: Alcohol use. Tobacco use. Drug use. Emotional well-being. Home and relationship well-being. Sexual activity. Eating habits. History of falls. Memory and ability to understand (cognition). Work and work Statistician. Screening  You may have the following tests or measurements: Height, weight, and BMI. Blood pressure. Lipid and cholesterol levels. These may be checked every 5 years, or more frequently if you are over 20 years old. Skin check. Lung cancer screening. You may have this screening every year starting at age 3 if you have a 30-pack-year history of smoking and currently smoke or have quit within the past 15 years. Fecal occult blood test (FOBT) of the stool. You may have this test every year starting at age 57. Flexible sigmoidoscopy or  colonoscopy. You may have a sigmoidoscopy every 5 years or a colonoscopy every 10 years starting at age 14. Prostate cancer screening. Recommendations will vary depending on your family history and other risks. Hepatitis C blood test. Hepatitis B blood test. Sexually transmitted disease (STD) testing. Diabetes screening. This is done by checking your blood sugar (glucose) after you have not eaten for a while (fasting). You may have this done every 1-3 years. Abdominal aortic aneurysm (AAA) screening. You may need this if you are a current or  former smoker. Osteoporosis. You may be screened starting at age 73 if you are at high risk. Talk with your health care provider about your test results, treatment options, and if necessary, the need for more tests. Vaccines  Your health care provider may recommend certain vaccines, such as: Influenza vaccine. This is recommended every year. Tetanus, diphtheria, and acellular pertussis (Tdap, Td) vaccine. You may need a Td booster every 10 years. Zoster vaccine. You may need this after age 68. Pneumococcal 13-valent conjugate (PCV13) vaccine. One dose is recommended after age 91. Pneumococcal polysaccharide (PPSV23) vaccine. One dose is recommended after age 58. Talk to your health care provider about which screenings and vaccines you need and how often you need them. This information is not intended to replace advice given to you by your health care provider. Make sure you discuss any questions you have with your health care provider. Document Released: 09/08/2015 Document Revised: 05/01/2016 Document Reviewed: 06/13/2015 Elsevier Interactive Patient Education  2017 [employer] Prevention in the Home Falls can cause injuries. They can happen to people of all ages. There are many things you can do to make your home safe and to help prevent falls. What can I do on the outside of my home? Regularly fix the edges of walkways and driveways and fix any cracks. Remove anything that might make you trip as you walk through a door, such as a raised step or threshold. Trim any bushes or trees on the path to your home. Use bright outdoor lighting. Clear any walking paths of anything that might make someone trip, such as rocks or tools. Regularly check to see if handrails are loose or broken. Make sure that both sides of any steps have handrails. Any raised decks and porches should have guardrails on the edges. Have any leaves, snow, or ice cleared regularly. Use sand or salt on walking paths  during winter. Clean up any spills in your garage right away. This includes oil or grease spills. What can I do in the bathroom? Use night lights. Install grab bars by the toilet and in the tub and shower. Do not use towel bars as grab bars. Use non-skid mats or decals in the tub or shower. If you need to sit down in the shower, use a plastic, non-slip stool. Keep the floor dry. Clean up any water that spills on the floor as soon as it happens. Remove soap buildup in the tub or shower regularly. Attach bath mats securely with double-sided non-slip rug tape. Do not have throw rugs and other things on the floor that can make you trip. What can I do in the bedroom? Use night lights. Make sure that you have a light by your bed that is easy to reach. Do not use any sheets or blankets that are too big for your bed. They should not hang down onto the floor. Have a firm chair that has side arms. You can use this for support while  you get dressed. Do not have throw rugs and other things on the floor that can make you trip. What can I do in the kitchen? Clean up any spills right away. Avoid walking on wet floors. Keep items that you use a lot in easy-to-reach places. If you need to reach something above you, use a strong step stool that has a grab bar. Keep electrical cords out of the way. Do not use floor polish or wax that makes floors slippery. If you must use wax, use non-skid floor wax. Do not have throw rugs and other things on the floor that can make you trip. What can I do with my stairs? Do not leave any items on the stairs. Make sure that there are handrails on both sides of the stairs and use them. Fix handrails that are broken or loose. Make sure that handrails are as long as the stairways. Check any carpeting to make sure that it is firmly attached to the stairs. Fix any carpet that is loose or worn. Avoid having throw rugs at the top or bottom of the stairs. If you do have throw  rugs, attach them to the floor with carpet tape. Make sure that you have a light switch at the top of the stairs and the bottom of the stairs. If you do not have them, ask someone to add them for you. What else can I do to help prevent falls? Wear shoes that: Do not have high heels. Have rubber bottoms. Are comfortable and fit you well. Are closed at the toe. Do not wear sandals. If you use a stepladder: Make sure that it is fully opened. Do not climb a closed stepladder. Make sure that both sides of the stepladder are locked into place. Ask someone to hold it for you, if possible. Clearly mark and make sure that you can see: Any grab bars or handrails. First and last steps. Where the edge of each step is. Use tools that help you move around (mobility aids) if they are needed. These include: Canes. Walkers. Scooters. Crutches. Turn on the lights when you go into a dark area. Replace any light bulbs as soon as they burn out. Set up your furniture so you have a clear path. Avoid moving your furniture around. If any of your floors are uneven, fix them. If there are any pets around you, be aware of where they are. Review your medicines with your doctor. Some medicines can make you feel dizzy. This can increase your chance of falling. Ask your doctor what other things that you can do to help prevent falls. This information is not intended to replace advice given to you by your health care provider. Make sure you discuss any questions you have with your health care provider. Document Released: 06/08/2009 Document Revised: 01/18/2016 Document Reviewed: 09/16/2014 Elsevier Interactive Patient Education  2017 [employer].

## 2022-12-09 ENCOUNTER — Other Ambulatory Visit: Payer: Self-pay | Admitting: Family Medicine

## 2022-12-09 DIAGNOSIS — Z125 Encounter for screening for malignant neoplasm of prostate: Secondary | ICD-10-CM

## 2022-12-09 DIAGNOSIS — K760 Fatty (change of) liver, not elsewhere classified: Secondary | ICD-10-CM

## 2022-12-09 DIAGNOSIS — R7303 Prediabetes: Secondary | ICD-10-CM

## 2022-12-09 DIAGNOSIS — E7800 Pure hypercholesterolemia, unspecified: Secondary | ICD-10-CM

## 2022-12-09 DIAGNOSIS — R5383 Other fatigue: Secondary | ICD-10-CM

## 2022-12-23 ENCOUNTER — Other Ambulatory Visit (INDEPENDENT_AMBULATORY_CARE_PROVIDER_SITE_OTHER): Payer: Medicare Other

## 2022-12-23 DIAGNOSIS — E7800 Pure hypercholesterolemia, unspecified: Secondary | ICD-10-CM

## 2022-12-23 DIAGNOSIS — R7303 Prediabetes: Secondary | ICD-10-CM

## 2022-12-23 DIAGNOSIS — R5383 Other fatigue: Secondary | ICD-10-CM | POA: Diagnosis not present

## 2022-12-23 DIAGNOSIS — Z125 Encounter for screening for malignant neoplasm of prostate: Secondary | ICD-10-CM | POA: Diagnosis not present

## 2022-12-23 DIAGNOSIS — K760 Fatty (change of) liver, not elsewhere classified: Secondary | ICD-10-CM

## 2022-12-23 LAB — CBC WITH DIFFERENTIAL/PLATELET
Basophils Absolute: 0.1 10*3/uL (ref 0.0–0.1)
Basophils Relative: 0.6 % (ref 0.0–3.0)
Eosinophils Absolute: 0.3 10*3/uL (ref 0.0–0.7)
Eosinophils Relative: 3.6 % (ref 0.0–5.0)
HCT: 46.1 % (ref 39.0–52.0)
Hemoglobin: 15.6 g/dL (ref 13.0–17.0)
Lymphocytes Relative: 16.4 % (ref 12.0–46.0)
Lymphs Abs: 1.4 10*3/uL (ref 0.7–4.0)
MCHC: 33.8 g/dL (ref 30.0–36.0)
MCV: 98.6 fl (ref 78.0–100.0)
Monocytes Absolute: 1.2 10*3/uL — ABNORMAL HIGH (ref 0.1–1.0)
Monocytes Relative: 13.9 % — ABNORMAL HIGH (ref 3.0–12.0)
Neutro Abs: 5.7 10*3/uL (ref 1.4–7.7)
Neutrophils Relative %: 65.5 % (ref 43.0–77.0)
Platelets: 246 10*3/uL (ref 150.0–400.0)
RBC: 4.67 Mil/uL (ref 4.22–5.81)
RDW: 14.5 % (ref 11.5–15.5)
WBC: 8.6 10*3/uL (ref 4.0–10.5)

## 2022-12-23 LAB — HEPATIC FUNCTION PANEL
ALT: 19 U/L (ref 0–53)
AST: 17 U/L (ref 0–37)
Albumin: 4.2 g/dL (ref 3.5–5.2)
Alkaline Phosphatase: 78 U/L (ref 39–117)
Bilirubin, Direct: 0.2 mg/dL (ref 0.0–0.3)
Total Bilirubin: 0.9 mg/dL (ref 0.2–1.2)
Total Protein: 6.1 g/dL (ref 6.0–8.3)

## 2022-12-23 LAB — BASIC METABOLIC PANEL
BUN: 12 mg/dL (ref 6–23)
CO2: 28 mEq/L (ref 19–32)
Calcium: 9.1 mg/dL (ref 8.4–10.5)
Chloride: 103 mEq/L (ref 96–112)
Creatinine, Ser: 1.14 mg/dL (ref 0.40–1.50)
GFR: 62.61 mL/min (ref >60.00)
Glucose, Bld: 106 mg/dL — ABNORMAL HIGH (ref 70–99)
Potassium: 4.4 mEq/L (ref 3.5–5.1)
Sodium: 139 mEq/L (ref 135–145)

## 2022-12-23 LAB — LIPID PANEL
Cholesterol: 180 mg/dL (ref 0–200)
HDL: 42.3 mg/dL (ref >39.00)
LDL Cholesterol: 116 mg/dL — ABNORMAL HIGH (ref 0–99)
NonHDL: 138.08
Total CHOL/HDL Ratio: 4
Triglycerides: 108 mg/dL (ref 0.0–149.0)
VLDL: 21.6 mg/dL (ref 0.0–40.0)

## 2022-12-23 LAB — PSA, MEDICARE: PSA: 1.67 ng/ml (ref 0.10–4.00)

## 2022-12-23 LAB — HEMOGLOBIN A1C: Hgb A1c MFr Bld: 5.5 % (ref 4.6–6.5)

## 2022-12-29 NOTE — Progress Notes (Unsigned)
Raelynn Corron T. Sheresa Cullop, MD, CAQ Sports Medicine [facility] at [facility] [address] [place], [address]  Phone: [phone]  FAX: [phone]  Erik Edwards - 77 y.o. male  MRN [id number]  Date of Birth: 06-10-1946  Date: 12/30/2022  PCP: Hannah Beat, MD  Referral: Hannah Beat, MD  No chief complaint on file.  Patient Care Team: Hannah Beat, MD as PCP - Erline Hau, MD as Consulting Physician (General Surgery) Tonny Bollman, MD as Consulting Physician (Cardiology) Subjective:   Erik Edwards is a 77 y.o. pleasant patient who presents with the following:  Preventative Health Maintenance Visit:  Health Maintenance Summary Reviewed and updated, unless pt declines services.  Tobacco History Reviewed. Alcohol: No concerns, no excessive use Exercise Habits: Some activity, rec at least 30 mins 5 times a week STD concerns: no risk or activity to increase risk Drug Use: None  Shingrix  Health Maintenance  Topic Date Due   Zoster Vaccines- Shingrix (1 of 2) Never done   COVID-19 Vaccine (4 - 2023-24 season) 04/26/2022   INFLUENZA VACCINE  03/27/2023   Medicare Annual Wellness (AWV)  09/05/2023   DTaP/Tdap/Td (2 - Td or Tdap) 07/01/2026   COLONOSCOPY (Pts 45-27yrs Insurance coverage will need to be confirmed)  01/24/2027   Pneumonia Vaccine 27+ Years old  Completed   Hepatitis C Screening  Completed   HPV VACCINES  Aged Out   Immunization History  Administered Date(s) Administered   Fluad Quad(high Dose 65+) 06/22/2020   Influenza, High Dose Seasonal PF 05/28/2019   Influenza, Seasonal, Injecte, Preservative Fre 07/27/2012   Influenza,inj,Quad PF,6+ Mos 06/17/2013, 06/14/2014, 06/27/2015, 07/08/2016, 06/20/2017, 06/15/2018   PFIZER(Purple Top)SARS-COV-2 Vaccination 10/19/2019, 11/09/2019, 07/07/2020   Pneumococcal Conjugate-13 12/01/2013   Pneumococcal Polysaccharide-23 07/27/2012   Tdap 07/01/2016   Zoster, Live  07/27/2012   Patient Active Problem List   Diagnosis Date Noted   Colon cancer screening    High cholesterol 06/17/2018   Prediabetes 06/17/2018   Aortic calcification (HCC) 11/20/2016   Bilateral inguinal hernias (direct & indirect) s/p laparoscopic repair with mesh 01/02/2017 11/20/2016   Fatty liver 11/20/2016   GERD (gastroesophageal reflux disease) 12/02/2013   Obesity (BMI 30-39.9) 12/02/2013    Past Medical History:  Diagnosis Date   Diverticulosis    GERD (gastroesophageal reflux disease) 12/02/2013   History of chicken pox    Inguinal hernia 01/03/2016   bilateral -repair    Past Surgical History:  Procedure Laterality Date   COLONOSCOPY WITH PROPOFOL N/A 01/23/2022   Procedure: COLONOSCOPY WITH PROPOFOL;  Surgeon: Toney Reil, MD;  Location: [facility];  Service: Gastroenterology;  Laterality: N/A;   HERNIA REPAIR     INGUINAL HERNIA REPAIR Bilateral 01/02/2017   Procedure: LAPAROSCOPIC EXPLORATION AND REPAIR OF BILATERAL INGUINAL HERNIA;  Surgeon: Karie Soda, MD;  Location: [facility];  Service: General;  Laterality: Bilateral;   INSERTION OF MESH Bilateral 01/02/2017   Procedure: INSERTION OF MESH;  Surgeon: Karie Soda, MD;  Location: [facility];  Service: General;  Laterality: Bilateral;    Family History  Problem Relation Age of Onset   Lymphoma Father     Social History   Social History Narrative   Not on file    Past Medical History, Surgical History, Social History, Family History, Problem List, Medications, and Allergies have been reviewed and updated if relevant.  Review of Systems: Pertinent positives are listed above.  Otherwise, a full 14 point review of systems has  been done in full and it is negative except where it is noted positive.  Objective:   There were no vitals taken for this visit. Ideal Body Weight:    Ideal Body Weight:   No results found.    09/04/2022   10:21 AM 12/26/2021    10:19 AM 09/03/2021   10:37 AM 09/01/2020   10:27 AM 06/17/2019   10:48 AM  Depression screen PHQ 2/9  Decreased Interest 0 0 0 0 0  Down, Depressed, Hopeless 0 0 0 0 0  PHQ - 2 Score 0 0 0 0 0  Altered sleeping    0 0  Tired, decreased energy    0 0  Change in appetite    0 0  Feeling bad or failure about yourself     0 0  Trouble concentrating    0 0  Moving slowly or fidgety/restless    0 0  Suicidal thoughts    0 0  PHQ-9 Score    0 0  Difficult doing work/chores    Not difficult at all Not difficult at all     GEN: well developed, well nourished, no acute distress Eyes: conjunctiva and lids normal, PERRLA, EOMI ENT: TM clear, nares clear, oral exam WNL Neck: supple, no lymphadenopathy, no thyromegaly, no JVD Pulm: clear to auscultation and percussion, respiratory effort normal CV: regular rate and rhythm, S1-S2, no murmur, rub or gallop, no bruits, peripheral pulses normal and symmetric, no cyanosis, clubbing, edema or varicosities GI: soft, non-tender; no hepatosplenomegaly, masses; active bowel sounds all quadrants GU: deferred Lymph: no cervical, axillary or inguinal adenopathy MSK: gait normal, muscle tone and strength WNL, no joint swelling, effusions, discoloration, crepitus  SKIN: clear, good turgor, color WNL, no rashes, lesions, or ulcerations Neuro: normal mental status, normal strength, sensation, and motion Psych: alert; oriented to person, place and time, normally interactive and not anxious or depressed in appearance.  All labs reviewed with patient. Results for orders placed or performed in visit on 12/23/22  Hemoglobin A1c  Result Value Ref Range   Hgb A1c MFr Bld 5.5 4.6 - 6.5 %  CBC with Differential/Platelet  Result Value Ref Range   WBC 8.6 4.0 - 10.5 K/uL   RBC 4.67 4.22 - 5.81 Mil/uL   Hemoglobin 15.6 13.0 - 17.0 g/dL   HCT 16.1 09.6 - 04.5 %   MCV 98.6 78.0 - 100.0 fl   MCHC 33.8 30.0 - 36.0 g/dL   RDW 40.9 81.1 - 91.4 %   Platelets 246.0 150.0  - 400.0 K/uL   Neutrophils Relative % 65.5 43.0 - 77.0 %   Lymphocytes Relative 16.4 12.0 - 46.0 %   Monocytes Relative 13.9 (H) 3.0 - 12.0 %   Eosinophils Relative 3.6 0.0 - 5.0 %   Basophils Relative 0.6 0.0 - 3.0 %   Neutro Abs 5.7 1.4 - 7.7 K/uL   Lymphs Abs 1.4 0.7 - 4.0 K/uL   Monocytes Absolute 1.2 (H) 0.1 - 1.0 K/uL   Eosinophils Absolute 0.3 0.0 - 0.7 K/uL   Basophils Absolute 0.1 0.0 - 0.1 K/uL  Basic metabolic panel  Result Value Ref Range   Sodium 139 135 - 145 mEq/L   Potassium 4.4 3.5 - 5.1 mEq/L   Chloride 103 96 - 112 mEq/L   CO2 28 19 - 32 mEq/L   Glucose, Bld 106 (H) 70 - 99 mg/dL   BUN 12 6 - 23 mg/dL   Creatinine, Ser 7.82 0.40 - 1.50 mg/dL  GFR 62.61 >60.00 mL/min   Calcium 9.1 8.4 - 10.5 mg/dL  PSA, Medicare  Result Value Ref Range   PSA 1.67 0.10 - 4.00 ng/ml  Lipid panel  Result Value Ref Range   Cholesterol 180 0 - 200 mg/dL   Triglycerides 161.0 0.0 - 149.0 mg/dL   HDL 96.04 >54.09 mg/dL   VLDL 81.1 0.0 - 91.4 mg/dL   LDL Cholesterol 782 (H) 0 - 99 mg/dL   Total CHOL/HDL Ratio 4    NonHDL 138.08   Hepatic function panel  Result Value Ref Range   Total Bilirubin 0.9 0.2 - 1.2 mg/dL   Bilirubin, Direct 0.2 0.0 - 0.3 mg/dL   Alkaline Phosphatase 78 39 - 117 U/L   AST 17 0 - 37 U/L   ALT 19 0 - 53 U/L   Total Protein 6.1 6.0 - 8.3 g/dL   Albumin 4.2 3.5 - 5.2 g/dL    Assessment and Plan:   No diagnosis found.  Health Maintenance Exam: The patient's preventative maintenance and recommended screening tests for an annual wellness exam were reviewed in full today. Brought up to date unless services declined.  Counselled on the importance of diet, exercise, and its role in overall health and mortality. The patient's FH and SH was reviewed, including their home life, tobacco status, and drug and alcohol status.  Follow-up in 1 year for physical exam or additional follow-up below.  Disposition: No follow-ups on file.  No orders of the defined  types were placed in this encounter.  There are no discontinued medications. No orders of the defined types were placed in this encounter.   Signed,  Elpidio Galea. Donnis Pecha, MD   Allergies as of 12/30/2022   No Known Allergies      Medication List        Accurate as of Dec 29, 2022  6:40 PM. If you have any questions, ask your nurse or doctor.          aspirin EC 81 MG tablet Take 81 mg by mouth every morning.   famotidine 20 MG tablet Commonly known as: PEPCID TAKE 1 TABLET BY MOUTH TWICE  DAILY   multivitamins ther. w/minerals Tabs tablet Take 1 tablet by mouth daily.   omeprazole 20 MG capsule Commonly known as: PRILOSEC TAKE 1 CAPSULE BY MOUTH DAILY

## 2022-12-30 ENCOUNTER — Ambulatory Visit (INDEPENDENT_AMBULATORY_CARE_PROVIDER_SITE_OTHER): Payer: Medicare Other | Admitting: Family Medicine

## 2022-12-30 ENCOUNTER — Encounter: Payer: Self-pay | Admitting: Family Medicine

## 2022-12-30 VITALS — BP 128/80 | HR 80 | Temp 97.7°F | Ht 73.0 in | Wt 257.5 lb

## 2022-12-30 DIAGNOSIS — Z0000 Encounter for general adult medical examination without abnormal findings: Secondary | ICD-10-CM

## 2022-12-31 ENCOUNTER — Other Ambulatory Visit: Payer: Self-pay | Admitting: Family Medicine

## 2023-06-04 DIAGNOSIS — D2239 Melanocytic nevi of other parts of face: Secondary | ICD-10-CM | POA: Diagnosis not present

## 2023-06-04 DIAGNOSIS — L565 Disseminated superficial actinic porokeratosis (DSAP): Secondary | ICD-10-CM | POA: Diagnosis not present

## 2023-06-04 DIAGNOSIS — D485 Neoplasm of uncertain behavior of skin: Secondary | ICD-10-CM | POA: Diagnosis not present

## 2023-06-04 DIAGNOSIS — L821 Other seborrheic keratosis: Secondary | ICD-10-CM | POA: Diagnosis not present

## 2023-06-04 DIAGNOSIS — D225 Melanocytic nevi of trunk: Secondary | ICD-10-CM | POA: Diagnosis not present

## 2023-06-04 DIAGNOSIS — L570 Actinic keratosis: Secondary | ICD-10-CM | POA: Diagnosis not present

## 2023-06-04 DIAGNOSIS — Z85828 Personal history of other malignant neoplasm of skin: Secondary | ICD-10-CM | POA: Diagnosis not present

## 2023-09-05 ENCOUNTER — Ambulatory Visit (INDEPENDENT_AMBULATORY_CARE_PROVIDER_SITE_OTHER): Payer: Medicare Other

## 2023-09-05 VITALS — Ht 74.0 in | Wt 257.0 lb

## 2023-09-05 DIAGNOSIS — Z0000 Encounter for general adult medical examination without abnormal findings: Secondary | ICD-10-CM

## 2023-09-05 NOTE — Patient Instructions (Addendum)
 Erik Edwards , Thank you for taking time to come for your Medicare Wellness Visit. I appreciate your ongoing commitment to your health goals. Please review the following plan we discussed and let me know if I can assist you in the future.   Referrals/Orders/Follow-Ups/Clinician Recommendations: none  This is a list of the screening recommended for you and due dates:  Health Maintenance  Topic Date Due   Zoster (Shingles) Vaccine (1 of 2) [birth year]/1965   Flu Shot  03/27/2023   COVID-19 Vaccine (4 - 2024-25 season) 04/27/2023   Medicare Annual Wellness Visit  09/04/2024   DTaP/Tdap/Td vaccine (2 - Td or Tdap) 07/01/2026   Colon Cancer Screening  01/24/2027   Pneumonia Vaccine  Completed   Hepatitis C Screening  Completed   HPV Vaccine  Aged Out    Advanced directives: (Copy Requested) Please bring a copy of your health care power of attorney and living will to the office to be added to your chart at your convenience.  Next Medicare Annual Wellness Visit scheduled for next year: Yes 09/07/2024 @ 9:30am

## 2023-09-05 NOTE — Progress Notes (Signed)
 Subjective:   Erik Edwards is a 78 y.o. male who presents for Medicare Annual/Subsequent preventive examination.   Visit Complete: Virtual I connected with  Erik Edwards on 09/05/23 by a audio enabled telemedicine application and verified that I am speaking with the correct person using two identifiers.  Patient Location: Home  Provider Location: Home Office  I discussed the limitations of evaluation and management by telemedicine. The patient expressed understanding and agreed to proceed.  Vital Signs: Because this visit was a virtual/telehealth visit, some criteria may be missing or patient reported. Any vitals not documented were not able to be obtained and vitals that have been documented are patient reported.  Patient Medicare AWV questionnaire was completed by the patient on 08/29/2023; I have confirmed that all information answered by patient is correct and no changes since this date.  Cardiac Risk Factors include: advanced age (>72men, >31 women);obesity (BMI >30kg/m2);male gender     Objective:    Today's Vitals   09/05/23 1018  Weight: 257 lb (116.6 kg)  Height: 6' 2 (1.88 m)  PainSc: 0-No pain   Body mass index is 33 kg/m.     09/05/2023   10:44 AM 09/04/2022   10:22 AM 01/23/2022    7:29 AM 09/03/2021   10:34 AM 09/01/2020   10:26 AM 06/17/2019   10:46 AM 06/15/2018    9:52 AM  Advanced Directives  Does Patient Have a Medical Advance Directive? Yes Yes No No Yes Yes No  Type of Estate Agent of [place];Living will Healthcare Power of [place];Living will   Healthcare Power of [place];Living will Healthcare Power of [place];Living will   Copy of Healthcare Power of Attorney in Chart? No - copy requested No - copy requested   No - copy requested No - copy requested   Would patient like information on creating a medical advance directive?    Yes (MAU/Ambulatory/Procedural Areas - Information given)   No - Patient declined    Current  Medications (verified) Outpatient Encounter Medications as of 09/05/2023  Medication Sig   aspirin  EC 81 MG tablet Take 81 mg by mouth every morning.   famotidine  (PEPCID ) 20 MG tablet TAKE 1 TABLET BY MOUTH TWICE  DAILY   Multiple Vitamins-Minerals (MULTIVITAMINS THER. W/MINERALS) TABS Take 1 tablet by mouth daily.   omeprazole  (PRILOSEC) 20 MG capsule TAKE 1 CAPSULE BY MOUTH DAILY   phenylephrine (SUDAFED PE) 10 MG TABS tablet Take 10 mg by mouth every 4 (four) hours as needed.   No facility-administered encounter medications on file as of 09/05/2023.    Allergies (verified) Patient has no known allergies.   History: Past Medical History:  Diagnosis Date   Diverticulosis    GERD (gastroesophageal reflux disease) 12/02/2013   History of chicken pox    Inguinal hernia 01/03/2016   bilateral -repair   Past Surgical History:  Procedure Laterality Date   COLONOSCOPY WITH PROPOFOL  N/A 01/23/2022   Procedure: COLONOSCOPY WITH PROPOFOL ;  Surgeon: Unk Corinn Skiff, MD;  Location: [facility];  Service: Gastroenterology;  Laterality: N/A;   HERNIA REPAIR     INGUINAL HERNIA REPAIR Bilateral 01/02/2017   Procedure: LAPAROSCOPIC EXPLORATION AND REPAIR OF BILATERAL INGUINAL HERNIA;  Surgeon: Sheldon Standing, MD;  Location[facility];  Service: General;  Laterality: Bilateral;   INSERTION OF MESH Bilateral 01/02/2017   Procedure: INSERTION OF MESH;  Surgeon: Sheldon Standing, MD;  Location: [facility];  Service: General;  Laterality: Bilateral;   Family History  Problem Relation Age of Onset   Lymphoma Father    Social History   Socioeconomic History   Marital status: Married    Spouse name: Not on file   Number of children: 2   Years of education: Not on file   Highest education level: Not on file  Occupational History   Occupation: retired  Tobacco Use   Smoking status: Former    Types: Cigarettes   Smokeless tobacco: Never  Vaping Use   Vaping  status: Never Used  Substance and Sexual Activity   Alcohol use: No   Drug use: No   Sexual activity: Not on file  Other Topics Concern   Not on file  Social History Narrative   Not on file   Social Drivers of Health   Financial Resource Strain: Low Risk  (09/05/2023)   Overall Financial Resource Strain (CARDIA)    Difficulty of Paying Living Expenses: Not hard at all  Food Insecurity: No Food Insecurity (09/05/2023)   Hunger Vital Sign    Worried About Running Out of Food in the Last Year: Never true    Ran Out of Food in the Last Year: Never true  Transportation Needs: No Transportation Needs (09/05/2023)   PRAPARE - Administrator, Civil Service (Medical): No    Lack of Transportation (Non-Medical): No  Physical Activity: Insufficiently Active (09/05/2023)   Exercise Vital Sign    Days of Exercise per Week: 3 days    Minutes of Exercise per Session: 20 min  Stress: No Stress Concern Present (09/05/2023)   [employer] of Occupational Health - Occupational Stress Questionnaire    Feeling of Stress : Not at all  Social Connections: Moderately Integrated (09/05/2023)   Social Connection and Isolation Panel [NHANES]    Frequency of Communication with Friends and Family: More than three times a week    Frequency of Social Gatherings with Friends and Family: More than three times a week    Attends Religious Services: More than 4 times per year    Active Member of [employer] or Organizations: No    Attends Engineer, Structural: Never    Marital Status: Married    Tobacco Counseling Counseling given: Not Answered  Clinical Intake:  Pre-visit preparation completed: Yes  Pain : No/denies pain Pain Score: 0-No pain   BMI - recorded: 33 Nutritional Status: BMI > 30  Obese Nutritional Risks: None Diabetes: No  How often do you need to have someone help you when you read instructions, pamphlets, or other written materials from your doctor or pharmacy?: 1 -  Never  Interpreter Needed?: No  Comments: lives with wife Information entered by :: B.Tarshia Kot,LPN  Activities of Daily Living    08/29/2023   10:35 AM  In your present state of health, do you have any difficulty performing the following activities:  Hearing? 0  Vision? 0  Difficulty concentrating or making decisions? 0  Walking or climbing stairs? 0  Dressing or bathing? 0  Doing errands, shopping? 0  Preparing Food and eating ? N  Using the Toilet? N  In the past six months, have you accidently leaked urine? Y  Do you have problems with loss of bowel control? N  Managing your Medications? N  Managing your Finances? N  Housekeeping or managing your Housekeeping? N    Patient Care Team: Watt Mirza, MD as PCP - Diedre Sheldon Standing, MD as Consulting Physician (General Surgery) Wonda Sharper, MD as Consulting Physician (Cardiology) Lucio Drivers  J, OD (Optometry)  Indicate any recent Medical Services you may have received from other than [facility] providers in the past year (date may be approximate).     Assessment:   This is a routine wellness examination for Erik Edwards.  Hearing/Vision screen Hearing Screening - Comments:: Pt says his hearing is fine Vision Screening - Comments:: Pt says vision good with glasses [facility] Lucio   Goals Addressed             This Visit's Progress    COMPLETED: Increase physical activity   On track    Starting 06/15/2018, I will walk for 30 min daily as weather permits.      Patient Stated   On track    06/17/2019, I will increase my exercise by walking more frequently and for longer.      Patient Stated   On track    Would like to maintain current routine     COMPLETED: Patient Stated   On track    Get back to walking more for exercise       Depression Screen    09/05/2023   10:25 AM 12/30/2022    9:16 AM 09/04/2022   10:21 AM 12/26/2021   10:19 AM 09/03/2021   10:37 AM 09/01/2020   10:27 AM 06/17/2019   10:48 AM  PHQ  2/9 Scores  PHQ - 2 Score 0 0 0 0 0 0 0  PHQ- 9 Score  0    0 0    Fall Risk    08/29/2023   10:35 AM 12/30/2022    9:16 AM 09/04/2022   10:23 AM 08/28/2022    7:36 AM 12/26/2021   10:28 AM  Fall Risk   Falls in the past year? 0 0 0 0 0  Number falls in past yr:  0 0    Injury with Fall?  0 0    Risk for fall due to : No Fall Risks  Impaired vision    Follow up Education provided;Falls prevention discussed  Falls prevention discussed      MEDICARE RISK AT HOME: Medicare Risk at Home Any stairs in or around the home?: (Patient-Rptd) Yes If so, are there any without handrails?: (Patient-Rptd) No Home free of loose throw rugs in walkways, pet beds, electrical cords, etc?: (Patient-Rptd) Yes Adequate lighting in your home to reduce risk of falls?: (Patient-Rptd) Yes Life alert?: (Patient-Rptd) No Use of a cane, walker or w/c?: (Patient-Rptd) No Grab bars in the bathroom?: (Patient-Rptd) No Shower chair or bench in shower?: (Patient-Rptd) No Elevated toilet seat or a handicapped toilet?: (Patient-Rptd) Yes  TIMED UP AND GO:  Was the test performed?  No    Cognitive Function:    09/01/2020   10:31 AM 06/17/2019   10:50 AM 06/15/2018    9:47 AM 11/20/2016   10:57 AM 11/20/2016   10:54 AM  MMSE - Mini Mental State Exam  Orientation to time 5 5 5 5 5   Orientation to Place 5 5 5 5 5   Registration 3 3 3 3 3   Attention/ Calculation 5 5 0 0 0  Recall 3 3 3 3 3   Language- name 2 objects   0 0 0  Language- repeat 1 1 1 1 1   Language- follow 3 step command   3 3 3   Language- read & follow direction   0 0 0  Write a sentence   0 0 0  Copy design   0 0 0  Total score  20 20 20         09/05/2023   10:30 AM 09/04/2022   10:24 AM  6CIT Screen  What Year? 0 points 0 points  What month? 0 points 0 points  What time? 0 points 0 points  Count back from 20 0 points 0 points  Months in reverse 0 points 0 points  Repeat phrase 0 points 0 points  Total Score 0 points 0 points     Immunizations Immunization History  Administered Date(s) Administered   Fluad Quad(high Dose 65+) 06/22/2020   Influenza, High Dose Seasonal PF 05/28/2019   Influenza, Seasonal, Injecte, Preservative Fre 07/27/2012   Influenza,inj,Quad PF,6+ Mos 06/17/2013, 06/14/2014, 06/27/2015, 07/08/2016, 06/20/2017, 06/15/2018   PFIZER(Purple Top)SARS-COV-2 Vaccination 10/19/2019, 11/09/2019, 07/07/2020   Pneumococcal Conjugate-13 12/01/2013   Pneumococcal Polysaccharide-23 07/27/2012   Tdap 07/01/2016   Zoster, Live 07/27/2012    TDAP status: Up to date  Flu Vaccine status: Due, Education has been provided regarding the importance of this vaccine. Advised may receive this vaccine at local pharmacy or Health Dept. Aware to provide a copy of the vaccination record if obtained from local pharmacy or Health Dept. Verbalized acceptance and understanding.  Pneumococcal vaccine status: Up to date  Covid-19 vaccine status: Completed vaccines  Qualifies for Shingles Vaccine? Yes   Zostavax completed No   Shingrix Completed?: No.    Education has been provided regarding the importance of this vaccine. Patient has been advised to call insurance company to determine out of pocket expense if they have not yet received this vaccine. Advised may also receive vaccine at local pharmacy or Health Dept. Verbalized acceptance and understanding.  Screening Tests Health Maintenance  Topic Date Due   COVID-19 Vaccine (4 - 2024-25 season) 09/21/2023 (Originally 04/27/2023)   INFLUENZA VACCINE  11/24/2023 (Originally 03/27/2023)   Zoster Vaccines- Shingrix (1 of 2) 02/23/2024 (Originally 10/12/1964)   Medicare Annual Wellness (AWV)  09/04/2024   DTaP/Tdap/Td (2 - Td or Tdap) 07/01/2026   Colonoscopy  01/24/2027   Pneumonia Vaccine 38+ Years old  Completed   Hepatitis C Screening  Completed   HPV VACCINES  Aged Out    Health Maintenance  There are no preventive care reminders to display for this  patient.   Colorectal cancer screening: No longer required.   Lung Cancer Screening: (Low Dose CT Chest recommended if Age 76-80 years, 20 pack-year currently smoking OR have quit w/in 15years.) does not qualify.   Lung Cancer Screening Referral: no  Additional Screening:  Hepatitis C Screening: does not qualify; Completed 11/20/2016  Vision Screening: Recommended annual ophthalmology exams for early detection of glaucoma and other disorders of the eye. Is the patient up to date with their annual eye exam?  Yes  Who is the provider or what is the name of the office in which the patient attends annual eye exams? Dr Lucio If pt is not established with a provider, would they like to be referred to a provider to establish care? No .   Dental Screening: Recommended annual dental exams for proper oral hygiene  Diabetic Foot Exam: n/a  Community Resource Referral / Chronic Care Management: CRR required this visit?  No   CCM required this visit?  Appt scheduled with PCP & Lab appt    Plan:     I have personally reviewed and noted the following in the patient's chart:   Medical and social history Use of alcohol, tobacco or illicit drugs  Current medications and supplements including opioid prescriptions. Patient is  not currently taking opioid prescriptions. Functional ability and status Nutritional status Physical activity Advanced directives List of other physicians Hospitalizations, surgeries, and ER visits in previous 12 months Vitals Screenings to include cognitive, depression, and falls Referrals and appointments  In addition, I have reviewed and discussed with patient certain preventive protocols, quality metrics, and best practice recommendations. A written personalized care plan for preventive services as well as general preventive health recommendations were provided to patient.     Erik LITTIE Saris, LPN   [id number]   After Visit Summary: (MyChart) Due to this  being a telephonic visit, the after visit summary with patients personalized plan was offered to patient via MyChart   Nurse Notes: The patient states he is doing well and has no concerns or questions at this time.

## 2023-09-06 ENCOUNTER — Other Ambulatory Visit: Payer: Self-pay | Admitting: Family Medicine

## 2023-11-15 ENCOUNTER — Other Ambulatory Visit: Payer: Self-pay | Admitting: Family Medicine

## 2023-12-02 ENCOUNTER — Telehealth: Payer: Self-pay | Admitting: *Deleted

## 2023-12-02 DIAGNOSIS — R5383 Other fatigue: Secondary | ICD-10-CM

## 2023-12-02 DIAGNOSIS — E7800 Pure hypercholesterolemia, unspecified: Secondary | ICD-10-CM

## 2023-12-02 DIAGNOSIS — K760 Fatty (change of) liver, not elsewhere classified: Secondary | ICD-10-CM

## 2023-12-02 DIAGNOSIS — R7303 Prediabetes: Secondary | ICD-10-CM

## 2023-12-02 DIAGNOSIS — Z125 Encounter for screening for malignant neoplasm of prostate: Secondary | ICD-10-CM

## 2023-12-02 NOTE — Telephone Encounter (Signed)
-----   Message from Alvina Chou sent at 12/01/2023  3:53 PM EDT ----- Regarding: Lab orders for [facility], 5.1.25 Patient is scheduled for CPX labs, please order future labs, Thanks , Camelia Eng

## 2023-12-25 ENCOUNTER — Other Ambulatory Visit (INDEPENDENT_AMBULATORY_CARE_PROVIDER_SITE_OTHER): Payer: Medicare Other

## 2023-12-25 DIAGNOSIS — Z125 Encounter for screening for malignant neoplasm of prostate: Secondary | ICD-10-CM | POA: Diagnosis not present

## 2023-12-25 DIAGNOSIS — K760 Fatty (change of) liver, not elsewhere classified: Secondary | ICD-10-CM | POA: Diagnosis not present

## 2023-12-25 DIAGNOSIS — R7303 Prediabetes: Secondary | ICD-10-CM

## 2023-12-25 DIAGNOSIS — E7800 Pure hypercholesterolemia, unspecified: Secondary | ICD-10-CM | POA: Diagnosis not present

## 2023-12-25 DIAGNOSIS — R5383 Other fatigue: Secondary | ICD-10-CM

## 2023-12-25 LAB — LIPID PANEL
Cholesterol: 207 mg/dL — ABNORMAL HIGH (ref 0–200)
HDL: 43.1 mg/dL (ref >39.00)
LDL Cholesterol: 123 mg/dL — ABNORMAL HIGH (ref 0–99)
NonHDL: 164.24
Total CHOL/HDL Ratio: 5
Triglycerides: 208 mg/dL — ABNORMAL HIGH (ref 0.0–149.0)
VLDL: 41.6 mg/dL — ABNORMAL HIGH (ref 0.0–40.0)

## 2023-12-25 LAB — CBC WITH DIFFERENTIAL/PLATELET
Basophils Absolute: 0.1 10*3/uL (ref 0.0–0.1)
Basophils Relative: 0.7 % (ref 0.0–3.0)
Eosinophils Absolute: 0.2 10*3/uL (ref 0.0–0.7)
Eosinophils Relative: 3.2 % (ref 0.0–5.0)
HCT: 48.9 % (ref 39.0–52.0)
Hemoglobin: 16.4 g/dL (ref 13.0–17.0)
Lymphocytes Relative: 20.9 % (ref 12.0–46.0)
Lymphs Abs: 1.5 10*3/uL (ref 0.7–4.0)
MCHC: 33.4 g/dL (ref 30.0–36.0)
MCV: 99.8 fl (ref 78.0–100.0)
Monocytes Absolute: 1 10*3/uL (ref 0.1–1.0)
Monocytes Relative: 13.6 % — ABNORMAL HIGH (ref 3.0–12.0)
Neutro Abs: 4.5 10*3/uL (ref 1.4–7.7)
Neutrophils Relative %: 61.6 % (ref 43.0–77.0)
Platelets: 273 10*3/uL (ref 150.0–400.0)
RBC: 4.9 Mil/uL (ref 4.22–5.81)
RDW: 14.2 % (ref 11.5–15.5)
WBC: 7.2 10*3/uL (ref 4.0–10.5)

## 2023-12-25 LAB — BASIC METABOLIC PANEL WITH GFR
BUN: 16 mg/dL (ref 6–23)
CO2: 27 meq/L (ref 19–32)
Calcium: 9.6 mg/dL (ref 8.4–10.5)
Chloride: 103 meq/L (ref 96–112)
Creatinine, Ser: 1.18 mg/dL (ref 0.40–1.50)
GFR: 59.65 mL/min — ABNORMAL LOW (ref >60.00)
Glucose, Bld: 97 mg/dL (ref 70–99)
Potassium: 4.4 meq/L (ref 3.5–5.1)
Sodium: 138 meq/L (ref 135–145)

## 2023-12-25 LAB — HEPATIC FUNCTION PANEL
ALT: 33 U/L (ref 0–53)
AST: 18 U/L (ref 0–37)
Albumin: 4.5 g/dL (ref 3.5–5.2)
Alkaline Phosphatase: 72 U/L (ref 39–117)
Bilirubin, Direct: 0.2 mg/dL (ref 0.0–0.3)
Total Bilirubin: 1.4 mg/dL — ABNORMAL HIGH (ref 0.2–1.2)
Total Protein: 6.6 g/dL (ref 6.0–8.3)

## 2023-12-25 LAB — PSA, MEDICARE: PSA: 2.56 ng/mL (ref 0.10–4.00)

## 2023-12-25 LAB — HEMOGLOBIN A1C: Hgb A1c MFr Bld: 5.5 % (ref 4.6–6.5)

## 2023-12-30 NOTE — Progress Notes (Unsigned)
 Erik Edwards T. Erik Marlar, MD, CAQ Sports Medicine [facility] [address] [place], [address]  Phone: [phone]  FAX: [phone]  JOESEPH GLAD - 78 y.o. male  MRN [id number]  Date of Birth: [birth year]  Date: 12/31/2023  PCP: Scherrie Curt, MD  Referral: Scherrie Curt, MD  No chief complaint on file.  Patient Care Team: Scherrie Curt, MD as PCP - Orbie Binder, MD as Consulting Physician (General Surgery) Arnoldo Lapping, MD as Consulting Physician (Cardiology) Kristopher Pheasant, OD (Optometry) Subjective:   Erik Edwards is a 78 y.o. pleasant patient who presents with the following:  Preventative Health Maintenance Visit:  Health Maintenance Summary Reviewed and updated, unless pt declines services.  Tobacco History Reviewed. Alcohol: No concerns, no excessive use Exercise Habits: Some activity, rec at least 30 mins 5 times a week STD concerns: no risk or activity to increase risk Drug Use: None  COVID booster Shingrix?  Erik Edwards is a generally healthy 78 year old who presents today for his annual physical exam after his Medicare wellness done by the LPN.  He does have a history of high cholesterol. He also has a history of prediabetes.  He is not on any medications for this.  Health Maintenance  Topic Date Due   COVID-19 Vaccine (4 - 2024-25 season) 04/27/2023   Zoster Vaccines- Shingrix (1 of 2) 02/23/2024 (Originally 10/12/1964)   INFLUENZA VACCINE  03/26/2024   Medicare Annual Wellness (AWV)  09/04/2024   DTaP/Tdap/Td (2 - Td or Tdap) 07/01/2026   Colonoscopy  01/24/2027   Pneumonia Vaccine 48+ Years old  Completed   Hepatitis C Screening  Completed   HPV VACCINES  Aged Out   Meningococcal B Vaccine  Aged Out   Immunization History  Administered Date(s) Administered   Fluad Quad(high Dose 65+) 06/22/2020   Influenza, High Dose Seasonal PF 05/28/2019   Influenza, Seasonal, Injecte, Preservative  Fre 07/27/2012   Influenza,inj,Quad PF,6+ Mos 06/17/2013, 06/14/2014, 06/27/2015, 07/08/2016, 06/20/2017, 06/15/2018   PFIZER(Purple Top)SARS-COV-2 Vaccination 10/19/2019, 11/09/2019, 07/07/2020   Pneumococcal Conjugate-13 12/01/2013   Pneumococcal Polysaccharide-23 07/27/2012   Tdap 07/01/2016   Zoster, Live 07/27/2012   Patient Active Problem List   Diagnosis Date Noted   High cholesterol 06/17/2018    Priority: Medium    Prediabetes 06/17/2018    Priority: Low   Fatty liver 11/20/2016    Priority: Low   GERD (gastroesophageal reflux disease) 12/02/2013    Priority: Low   Aortic calcification (HCC) 11/20/2016   Bilateral inguinal hernias (direct & indirect) s/p laparoscopic repair with mesh 01/02/2017 11/20/2016   Obesity (BMI 30-39.9) 12/02/2013    Past Medical History:  Diagnosis Date   Diverticulosis    GERD (gastroesophageal reflux disease) 12/02/2013   History of chicken pox    Inguinal hernia 01/03/2016   bilateral -repair    Past Surgical History:  Procedure Laterality Date   COLONOSCOPY WITH PROPOFOL  N/A 01/23/2022   Procedure: COLONOSCOPY WITH PROPOFOL ;  Surgeon: Selena Daily, MD;  Location: [facility];  Service: Gastroenterology;  Laterality: N/A;   HERNIA REPAIR     INGUINAL HERNIA REPAIR Bilateral 01/02/2017   Procedure: LAPAROSCOPIC EXPLORATION AND REPAIR OF BILATERAL INGUINAL HERNIA;  Surgeon: Candyce Champagne, MD;  Location: [facility];  Service: General;  Laterality: Bilateral;   INSERTION OF MESH Bilateral 01/02/2017   Procedure: INSERTION OF MESH;  Surgeon: Candyce Champagne, MD;  Location: [facility] ;  Service: General;  Laterality: Bilateral;  Family History  Problem Relation Age of Onset   Lymphoma Father     Social History   Social History Narrative   Not on file    Past Medical History, Surgical History, Social History, Family History, Problem List, Medications, and Allergies have been reviewed and  updated if relevant.  Review of Systems: Pertinent positives are listed above.  Otherwise, a full 14 point review of systems has been done in full and it is negative except where it is noted positive.  Objective:   There were no vitals taken for this visit. Ideal Body Weight:    Ideal Body Weight:   No results found.    09/05/2023   10:25 AM 12/30/2022    9:16 AM 09/04/2022   10:21 AM 12/26/2021   10:19 AM 09/03/2021   10:37 AM  Depression screen PHQ 2/9  Decreased Interest 0 0 0 0 0  Down, Depressed, Hopeless 0 0 0 0 0  PHQ - 2 Score 0 0 0 0 0  Altered sleeping  0     Tired, decreased energy  0     Change in appetite  0     Feeling bad or failure about yourself   0     Trouble concentrating  0     Moving slowly or fidgety/restless  0     Suicidal thoughts  0     PHQ-9 Score  0     Difficult doing work/chores  Not difficult at all        GEN: well developed, well nourished, no acute distress Eyes: conjunctiva and lids normal, PERRLA, EOMI ENT: TM clear, nares clear, oral exam WNL Neck: supple, no lymphadenopathy, no thyromegaly, no JVD Pulm: clear to auscultation and percussion, respiratory effort normal CV: regular rate and rhythm, S1-S2, no murmur, rub or gallop, no bruits, peripheral pulses normal and symmetric, no cyanosis, clubbing, edema or varicosities GI: soft, non-tender; no hepatosplenomegaly, masses; active bowel sounds all quadrants GU: deferred Lymph: no cervical, axillary or inguinal adenopathy MSK: gait normal, muscle tone and strength WNL, no joint swelling, effusions, discoloration, crepitus  SKIN: clear, good turgor, color WNL, no rashes, lesions, or ulcerations Neuro: normal mental status, normal strength, sensation, and motion Psych: alert; oriented to person, place and time, normally interactive and not anxious or depressed in appearance.  All labs reviewed with patient. Results for orders placed or performed in visit on 12/25/23  Hemoglobin A1c    Collection Time: 12/25/23 10:26 AM  Result Value Ref Range   Hgb A1c MFr Bld 5.5 4.6 - 6.5 %  CBC with Differential/Platelet   Collection Time: 12/25/23 10:26 AM  Result Value Ref Range   WBC 7.2 4.0 - 10.5 K/uL   RBC 4.90 4.22 - 5.81 Mil/uL   Hemoglobin 16.4 13.0 - 17.0 g/dL   HCT 14.7 82.9 - 56.2 %   MCV 99.8 78.0 - 100.0 fl   MCHC 33.4 30.0 - 36.0 g/dL   RDW 13.0 86.5 - 78.4 %   Platelets 273.0 150.0 - 400.0 K/uL   Neutrophils Relative % 61.6 43.0 - 77.0 %   Lymphocytes Relative 20.9 12.0 - 46.0 %   Monocytes Relative 13.6 (H) 3.0 - 12.0 %   Eosinophils Relative 3.2 0.0 - 5.0 %   Basophils Relative 0.7 0.0 - 3.0 %   Neutro Abs 4.5 1.4 - 7.7 K/uL   Lymphs Abs 1.5 0.7 - 4.0 K/uL   Monocytes Absolute 1.0 0.1 - 1.0 K/uL   Eosinophils Absolute 0.2  0.0 - 0.7 K/uL   Basophils Absolute 0.1 0.0 - 0.1 K/uL  Basic metabolic panel   Collection Time: 12/25/23 10:26 AM  Result Value Ref Range   Sodium 138 135 - 145 mEq/L   Potassium 4.4 3.5 - 5.1 mEq/L   Chloride 103 96 - 112 mEq/L   CO2 27 19 - 32 mEq/L   Glucose, Bld 97 70 - 99 mg/dL   BUN 16 6 - 23 mg/dL   Creatinine, Ser 0.98 0.40 - 1.50 mg/dL   GFR 11.91 (L) >47.82 mL/min   Calcium 9.6 8.4 - 10.5 mg/dL  PSA, Medicare   Collection Time: 12/25/23 10:26 AM  Result Value Ref Range   PSA 2.56 0.10 - 4.00 ng/ml  Lipid panel   Collection Time: 12/25/23 10:26 AM  Result Value Ref Range   Cholesterol 207 (H) 0 - 200 mg/dL   Triglycerides 956.2 (H) 0.0 - 149.0 mg/dL   HDL 13.08 >65.78 mg/dL   VLDL 46.9 (H) 0.0 - 62.9 mg/dL   LDL Cholesterol 528 (H) 0 - 99 mg/dL   Total CHOL/HDL Ratio 5    NonHDL 164.24   Hepatic function panel   Collection Time: 12/25/23 10:26 AM  Result Value Ref Range   Total Bilirubin 1.4 (H) 0.2 - 1.2 mg/dL   Bilirubin, Direct 0.2 0.0 - 0.3 mg/dL   Alkaline Phosphatase 72 39 - 117 U/L   AST 18 0 - 37 U/L   ALT 33 0 - 53 U/L   Total Protein 6.6 6.0 - 8.3 g/dL   Albumin 4.5 3.5 - 5.2 g/dL    Assessment  and Plan:     ICD-10-CM   1. Healthcare maintenance  Z00.00       Health Maintenance Exam: The patient's preventative maintenance and recommended screening tests for an annual wellness exam were reviewed in full today. Brought up to date unless services declined.  Counselled on the importance of diet, exercise, and its role in overall health and mortality. The patient's FH and SH was reviewed, including their home life, tobacco status, and drug and alcohol status.  Follow-up in 1 year for physical exam or additional follow-up below.  Disposition: No follow-ups on file.  No orders of the defined types were placed in this encounter.  There are no discontinued medications. No orders of the defined types were placed in this encounter.   Signed,  Ranny Bye. Emmet Messer, MD   Allergies as of 12/31/2023   No Known Allergies      Medication List        Accurate as of Dec 30, 2023 11:17 AM. If you have any questions, ask your nurse or doctor.          aspirin  EC 81 MG tablet Take 81 mg by mouth every morning.   famotidine  20 MG tablet Commonly known as: PEPCID  TAKE 1 TABLET BY MOUTH TWICE  DAILY   multivitamins ther. w/minerals Tabs tablet Take 1 tablet by mouth daily.   omeprazole  20 MG capsule Commonly known as: PRILOSEC TAKE 1 CAPSULE BY MOUTH DAILY   phenylephrine 10 MG Tabs tablet Commonly known as: SUDAFED PE Take 10 mg by mouth every 4 (four) hours as needed.

## 2023-12-31 ENCOUNTER — Encounter: Payer: Self-pay | Admitting: Family Medicine

## 2023-12-31 ENCOUNTER — Ambulatory Visit (INDEPENDENT_AMBULATORY_CARE_PROVIDER_SITE_OTHER): Payer: Medicare Other | Admitting: Family Medicine

## 2023-12-31 VITALS — BP 138/80 | HR 72 | Temp 98.3°F | Ht 72.75 in | Wt 261.4 lb

## 2023-12-31 DIAGNOSIS — Z0000 Encounter for general adult medical examination without abnormal findings: Secondary | ICD-10-CM | POA: Diagnosis not present

## 2024-01-24 ENCOUNTER — Other Ambulatory Visit: Payer: Self-pay | Admitting: Family Medicine

## 2024-06-07 DIAGNOSIS — L821 Other seborrheic keratosis: Secondary | ICD-10-CM | POA: Diagnosis not present

## 2024-06-07 DIAGNOSIS — L565 Disseminated superficial actinic porokeratosis (DSAP): Secondary | ICD-10-CM | POA: Diagnosis not present

## 2024-06-07 DIAGNOSIS — L570 Actinic keratosis: Secondary | ICD-10-CM | POA: Diagnosis not present

## 2024-06-07 DIAGNOSIS — D225 Melanocytic nevi of trunk: Secondary | ICD-10-CM | POA: Diagnosis not present

## 2024-06-07 DIAGNOSIS — Z85828 Personal history of other malignant neoplasm of skin: Secondary | ICD-10-CM | POA: Diagnosis not present

## 2024-09-07 ENCOUNTER — Ambulatory Visit: Payer: Medicare Other

## 2024-09-07 VITALS — Ht 72.75 in | Wt 261.0 lb

## 2024-09-07 DIAGNOSIS — Z0000 Encounter for general adult medical examination without abnormal findings: Secondary | ICD-10-CM

## 2024-09-07 NOTE — Progress Notes (Signed)
 "  Please attest and cosign this visit due to patients primary care provider not being in the office at the time the visit was completed.   Chief Complaint  Patient presents with   Medicare Wellness     Subjective:   Erik Edwards is a 79 y.o. male who presents for a Medicare Annual Wellness Visit.  Visit info / Clinical Intake: Medicare Wellness Visit Type:: Subsequent Annual Wellness Visit Persons participating in visit and providing information:: patient Medicare Wellness Visit Mode:: Video Since this visit was completed virtually, some vitals may be partially provided or unavailable. Missing vitals are due to the limitations of the virtual format.: Unable to obtain vitals - no equipment If Telephone or Video please confirm:: I connected with patient using audio/video enable telemedicine. I verified patient identity with two identifiers, discussed telehealth limitations, and patient agreed to proceed. Patient Location:: home Provider Location:: office clinic Interpreter Needed?: No Pre-visit prep was completed: yes AWV questionnaire completed by patient prior to visit?: yes Date:: 09/07/24 Living arrangements:: (Patient-Rptd) lives with spouse/significant other Patient's Overall Health Status Rating: (Patient-Rptd) very good Typical amount of pain: (Patient-Rptd) none Does pain affect daily life?: (Patient-Rptd) no Are you currently prescribed opioids?: no  Dietary Habits and Nutritional Risks How many meals a day?: (Patient-Rptd) 3 Eats fruit and vegetables daily?: (Patient-Rptd) yes Most meals are obtained by: (Patient-Rptd) preparing own meals; eating out In the last 2 weeks, have you had any of the following?: none Diabetic:: no  Functional Status Activities of Daily Living (to include ambulation/medication): (Patient-Rptd) Independent Ambulation: (Patient-Rptd) Independent Medication Administration: (Patient-Rptd) Independent Home Management (perform basic housework  or laundry): (Patient-Rptd) Independent Manage your own finances?: (Patient-Rptd) yes Primary transportation is: (Patient-Rptd) driving Concerns about vision?: no *vision screening is required for WTM* Concerns about hearing?: no  Fall Screening Falls in the past year?: (Patient-Rptd) 0 Number of falls in past year: 0 Was there an injury with Fall?: 0 Fall Risk Category Calculator: 0 Patient Fall Risk Level: Low Fall Risk  Fall Risk Patient at Risk for Falls Due to: No Fall Risks Fall risk Follow up: Falls evaluation completed; Education provided; Falls prevention discussed  Home and Transportation Safety: All rugs have non-skid backing?: (Patient-Rptd) yes All stairs or steps have railings?: (Patient-Rptd) N/A, no stairs Grab bars in the bathtub or shower?: (Patient-Rptd) yes Have non-skid surface in bathtub or shower?: (Patient-Rptd) yes Good home lighting?: (Patient-Rptd) yes Regular seat belt use?: (Patient-Rptd) yes Hospital stays in the last year:: (Patient-Rptd) no  Cognitive Assessment Difficulty concentrating, remembering, or making decisions? : (Patient-Rptd) no Will 6CIT or Mini Cog be Completed: yes What year is it?: 0 points What month is it?: 0 points Give patient an address phrase to remember (5 components): [address] About what time is it?: 0 points Count backwards from 20 to 1: 0 points Say the months of the year in reverse: 0 points Repeat the address phrase from earlier: 0 points 6 CIT Score: 0 points  Advance Directives (For Healthcare) Does Patient Have a Medical Advance Directive?: No Would patient like information on creating a medical advance directive?: Yes (MAU/Ambulatory/Procedural Areas - Information given)  Reviewed/Updated  Reviewed/Updated: Reviewed All (Medical, Surgical, Family, Medications, Allergies, Care Teams, Patient Goals)    Allergies (verified) Patient has no known allergies.   Current Medications  (verified) Outpatient Encounter Medications as of 09/07/2024  Medication Sig   aspirin  EC 81 MG tablet Take 81 mg by mouth every morning.   famotidine  (PEPCID )  20 MG tablet TAKE 1 TABLET BY MOUTH TWICE  DAILY   Multiple Vitamins-Minerals (MULTIVITAMINS THER. W/MINERALS) TABS Take 1 tablet by mouth daily.   omeprazole  (PRILOSEC) 20 MG capsule TAKE 1 CAPSULE BY MOUTH DAILY   phenylephrine (SUDAFED PE) 10 MG TABS tablet Take 10 mg by mouth every 4 (four) hours as needed.   No facility-administered encounter medications on file as of 09/07/2024.    History: Past Medical History:  Diagnosis Date   Diverticulosis    GERD (gastroesophageal reflux disease) 12/02/2013   History of chicken pox    Inguinal hernia 01/03/2016   bilateral -repair   Past Surgical History:  Procedure Laterality Date   COLONOSCOPY WITH PROPOFOL  N/A 01/23/2022   Procedure: COLONOSCOPY WITH PROPOFOL ;  Surgeon: Unk Corinn Skiff, MD;  Location: [facility];  Service: Gastroenterology;  Laterality: N/A;   HERNIA REPAIR     INGUINAL HERNIA REPAIR Bilateral 01/02/2017   Procedure: LAPAROSCOPIC EXPLORATION AND REPAIR OF BILATERAL INGUINAL HERNIA;  Surgeon: Sheldon Standing, MD;  Location: [facility];  Service: General;  Laterality: Bilateral;   INSERTION OF MESH Bilateral 01/02/2017   Procedure: INSERTION OF MESH;  Surgeon: Sheldon Standing, MD;  Location: [facility];  Service: General;  Laterality: Bilateral;   Family History  Problem Relation Age of Onset   Lymphoma Father    Social History   Occupational History   Occupation: retired  Tobacco Use   Smoking status: Former    Types: Cigarettes   Smokeless tobacco: Never  Vaping Use   Vaping status: Never Used  Substance and Sexual Activity   Alcohol use: No   Drug use: No   Sexual activity: Not on file   Tobacco Counseling Counseling given: Not Answered  SDOH Screenings   Food Insecurity: No Food Insecurity (09/07/2024)   Housing: Unknown (09/07/2024)  Transportation Needs: No Transportation Needs (09/07/2024)  Utilities: Not At Risk (09/07/2024)  Alcohol Screen: Low Risk (09/05/2023)  Depression (PHQ2-9): Low Risk (09/07/2024)  Financial Resource Strain: Low Risk (09/07/2024)  Physical Activity: Insufficiently Active (09/07/2024)  Social Connections: Socially Integrated (09/07/2024)  Stress: No Stress Concern Present (09/07/2024)  Tobacco Use: Medium Risk (09/07/2024)  Health Literacy: Adequate Health Literacy (09/07/2024)   See flowsheets for full screening details  Depression Screen PHQ 2 & 9 Depression Scale- Over the past 2 weeks, how often have you been bothered by any of the following problems? Little interest or pleasure in doing things: 0 Feeling down, depressed, or hopeless (PHQ Adolescent also includes...irritable): 0 PHQ-2 Total Score: 0     Goals Addressed             This Visit's Progress    COMPLETED: Patient Stated       06/17/2019, I will increase my exercise by walking more frequently and for longer.      COMPLETED: Patient Stated       09/01/2020, I will continue to walk 3 days a week for 30 minutes.      Patient Stated   On track    Would like to maintain current routine     Patient Stated        I would like to stay healthy:that is what it is about             Objective:    Today's Vitals   09/07/24 0934  Weight: 261 lb (118.4 kg)  Height: 6' 0.75 (1.848 m)   Body mass index is 34.67 kg/m.  Hearing/Vision screen Vision Screening -  Comments:: UTD w/visits Dr Lucio Immunizations and Health Maintenance Health Maintenance  Topic Date Due   Zoster Vaccines- Shingrix (1 of 2) 10/12/1964   Influenza Vaccine  03/26/2024   COVID-19 Vaccine (4 - 2025-26 season) 04/26/2024   Medicare Annual Wellness (AWV)  09/04/2024   DTaP/Tdap/Td (2 - Td or Tdap) 07/01/2026   Colonoscopy  01/24/2027   Pneumococcal Vaccine: 50+ Years  Completed   Hepatitis C Screening  Completed    Meningococcal B Vaccine  Aged Out        Assessment/Plan:  This is a routine wellness examination for Demetrick.  Patient Care Team: Watt Mirza, MD as PCP - Diedre Sheldon Standing, MD as Consulting Physician (General Surgery) Wonda Sharper, MD as Consulting Physician (Cardiology) Lucio Franky PARAS, OD (Optometry)  I have personally reviewed and noted the following in the patients chart:   Medical and social history Use of alcohol, tobacco or illicit drugs  Current medications and supplements including opioid prescriptions. Functional ability and status Nutritional status Physical activity Advanced directives List of other physicians Hospitalizations, surgeries, and ER visits in previous 12 months Vitals Screenings to include cognitive, depression, and falls Referrals and appointments  No orders of the defined types were placed in this encounter.  In addition, I have reviewed and discussed with patient certain preventive protocols, quality metrics, and best practice recommendations. A written personalized care plan for preventive services as well as general preventive health recommendations were provided to patient.   Erminio LITTIE Saris, LPN   [id number]    After Visit Summary: (MyChart) Due to this being a telephonic visit, the after visit summary with patients personalized plan was offered to patient via MyChart   Nurse Notes: No voiced or noted concerns at this time Patient advised to keep follow-up appointment with PCP (May 2026 for CPE) Vaccines not given: Shingles, Influenza, and Covid declined today  "

## 2024-09-07 NOTE — Patient Instructions (Signed)
 Erik Edwards,  Thank you for taking the time for your Medicare Wellness Visit. I appreciate your continued commitment to your health goals. Please review the care plan we discussed, and feel free to reach out if I can assist you further.  Please note that Annual Wellness Visits do not include a physical exam. Some assessments may be limited, especially if the visit was conducted virtually. If needed, we may recommend an in-person follow-up with your provider.  Ongoing Care Seeing your primary care provider every 3 to 6 months helps us  monitor your health and provide consistent, personalized care.   Referrals If a referral was made during today's visit and you haven't received any updates within two weeks, please contact the referred provider directly to check on the status.  Recommended Screenings:  Health Maintenance  Topic Date Due   Zoster (Shingles) Vaccine (1 of 2) 10/12/1964   Flu Shot  03/26/2024   COVID-19 Vaccine (4 - 2025-26 season) 04/26/2024   Medicare Annual Wellness Visit  09/04/2024   DTaP/Tdap/Td vaccine (2 - Td or Tdap) 07/01/2026   Colon Cancer Screening  01/24/2027   Pneumococcal Vaccine for age over 63  Completed   Hepatitis C Screening  Completed   Meningitis B Vaccine  Aged Out       09/07/2024    8:47 AM  Advanced Directives  Does Patient Have a Medical Advance Directive? No  Would patient like information on creating a medical advance directive? Yes (MAU/Ambulatory/Procedural Areas - Information given)    Vision: Annual vision screenings are recommended for early detection of glaucoma, cataracts, and diabetic retinopathy. These exams can also reveal signs of chronic conditions such as diabetes and high blood pressure.  Dental: Annual dental screenings help detect early signs of oral cancer, gum disease, and other conditions linked to overall health, including heart disease and diabetes.  Please see the attached documents for additional preventive care  recommendations.

## 2024-12-27 ENCOUNTER — Other Ambulatory Visit

## 2025-01-03 ENCOUNTER — Encounter: Admitting: Family Medicine
